# Patient Record
Sex: Female | Born: 1935 | Race: White | Hispanic: No | Smoking: Former smoker
Health system: Southern US, Community
[De-identification: ages and names within clinical notes are randomized; demographics above are authoritative.]

## PROBLEM LIST (undated history)

## (undated) DIAGNOSIS — I739 Peripheral vascular disease, unspecified: Secondary | ICD-10-CM

## (undated) DIAGNOSIS — F419 Anxiety disorder, unspecified: Secondary | ICD-10-CM

## (undated) DIAGNOSIS — E785 Hyperlipidemia, unspecified: Secondary | ICD-10-CM

## (undated) DIAGNOSIS — M1990 Unspecified osteoarthritis, unspecified site: Secondary | ICD-10-CM

## (undated) DIAGNOSIS — I10 Essential (primary) hypertension: Secondary | ICD-10-CM

## (undated) DIAGNOSIS — M79606 Pain in leg, unspecified: Secondary | ICD-10-CM

## (undated) DIAGNOSIS — F0390 Unspecified dementia without behavioral disturbance: Secondary | ICD-10-CM

## (undated) HISTORY — PX: BACK SURGERY: SHX140

## (undated) HISTORY — DX: Hyperlipidemia, unspecified: E78.5

## (undated) HISTORY — DX: Unspecified osteoarthritis, unspecified site: M19.90

## (undated) HISTORY — DX: Essential (primary) hypertension: I10

## (undated) HISTORY — DX: Pain in leg, unspecified: M79.606

## (undated) HISTORY — DX: Peripheral vascular disease, unspecified: I73.9

## (undated) HISTORY — DX: Unspecified dementia, unspecified severity, without behavioral disturbance, psychotic disturbance, mood disturbance, and anxiety: F03.90

## (undated) HISTORY — DX: Anxiety disorder, unspecified: F41.9

---

## 1997-12-25 ENCOUNTER — Other Ambulatory Visit: Admission: RE | Admit: 1997-12-25 | Discharge: 1997-12-25 | Payer: Self-pay | Admitting: *Deleted

## 1999-01-14 ENCOUNTER — Other Ambulatory Visit: Admission: RE | Admit: 1999-01-14 | Discharge: 1999-01-14 | Payer: Self-pay | Admitting: *Deleted

## 2000-08-18 ENCOUNTER — Other Ambulatory Visit: Admission: RE | Admit: 2000-08-18 | Discharge: 2000-08-18 | Payer: Self-pay | Admitting: Gynecology

## 2004-01-22 ENCOUNTER — Encounter: Admission: RE | Admit: 2004-01-22 | Discharge: 2004-01-22 | Payer: Self-pay | Admitting: General Surgery

## 2004-05-14 ENCOUNTER — Encounter: Admission: RE | Admit: 2004-05-14 | Discharge: 2004-05-14 | Payer: Self-pay | Admitting: Family Medicine

## 2004-08-11 ENCOUNTER — Ambulatory Visit: Payer: Self-pay | Admitting: Internal Medicine

## 2007-12-10 ENCOUNTER — Emergency Department (HOSPITAL_COMMUNITY): Admission: EM | Admit: 2007-12-10 | Discharge: 2007-12-10 | Payer: Self-pay | Admitting: Family Medicine

## 2007-12-14 ENCOUNTER — Inpatient Hospital Stay (HOSPITAL_COMMUNITY): Admission: EM | Admit: 2007-12-14 | Discharge: 2007-12-21 | Payer: Self-pay | Admitting: Emergency Medicine

## 2008-04-04 ENCOUNTER — Inpatient Hospital Stay (HOSPITAL_COMMUNITY): Admission: AD | Admit: 2008-04-04 | Discharge: 2008-04-11 | Payer: Self-pay | Admitting: Internal Medicine

## 2008-06-27 ENCOUNTER — Inpatient Hospital Stay (HOSPITAL_COMMUNITY): Admission: EM | Admit: 2008-06-27 | Discharge: 2008-07-01 | Payer: Self-pay | Admitting: Emergency Medicine

## 2008-06-28 ENCOUNTER — Encounter (INDEPENDENT_AMBULATORY_CARE_PROVIDER_SITE_OTHER): Payer: Self-pay | Admitting: Internal Medicine

## 2008-08-02 ENCOUNTER — Emergency Department (HOSPITAL_COMMUNITY): Admission: EM | Admit: 2008-08-02 | Discharge: 2008-08-02 | Payer: Self-pay | Admitting: Emergency Medicine

## 2009-01-31 ENCOUNTER — Emergency Department (HOSPITAL_BASED_OUTPATIENT_CLINIC_OR_DEPARTMENT_OTHER): Admission: EM | Admit: 2009-01-31 | Discharge: 2009-01-31 | Payer: Self-pay | Admitting: Emergency Medicine

## 2009-01-31 ENCOUNTER — Ambulatory Visit: Payer: Self-pay | Admitting: Diagnostic Radiology

## 2009-05-21 IMAGING — CR DG ABDOMEN ACUTE W/ 1V CHEST
3 series · 3 of 3 positions shown · non-contrast
Comparison: 04/04/2008

CLINICAL DATA: Nausea, upper abdominal pain

ACUTE ABDOMEN SERIES (ABDOMEN 2 VIEW & CHEST 1 VIEW)
TECHNIQUE: Two views abdomen one-view chest

[w chest pa]
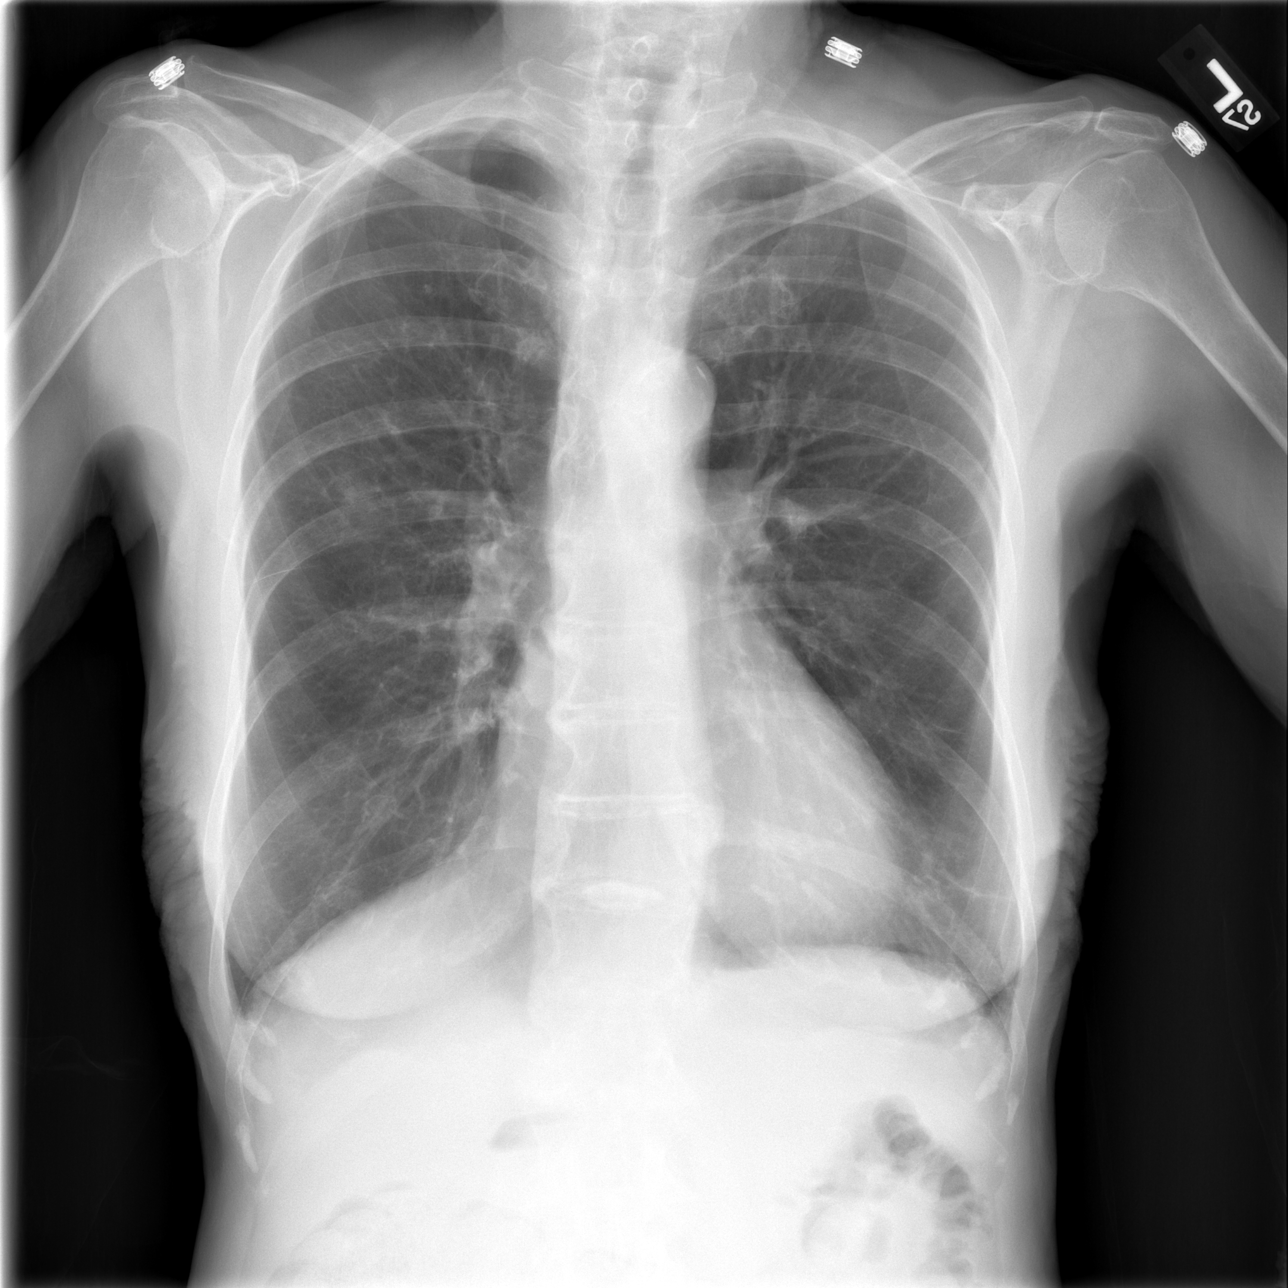

[w abdomen upright]
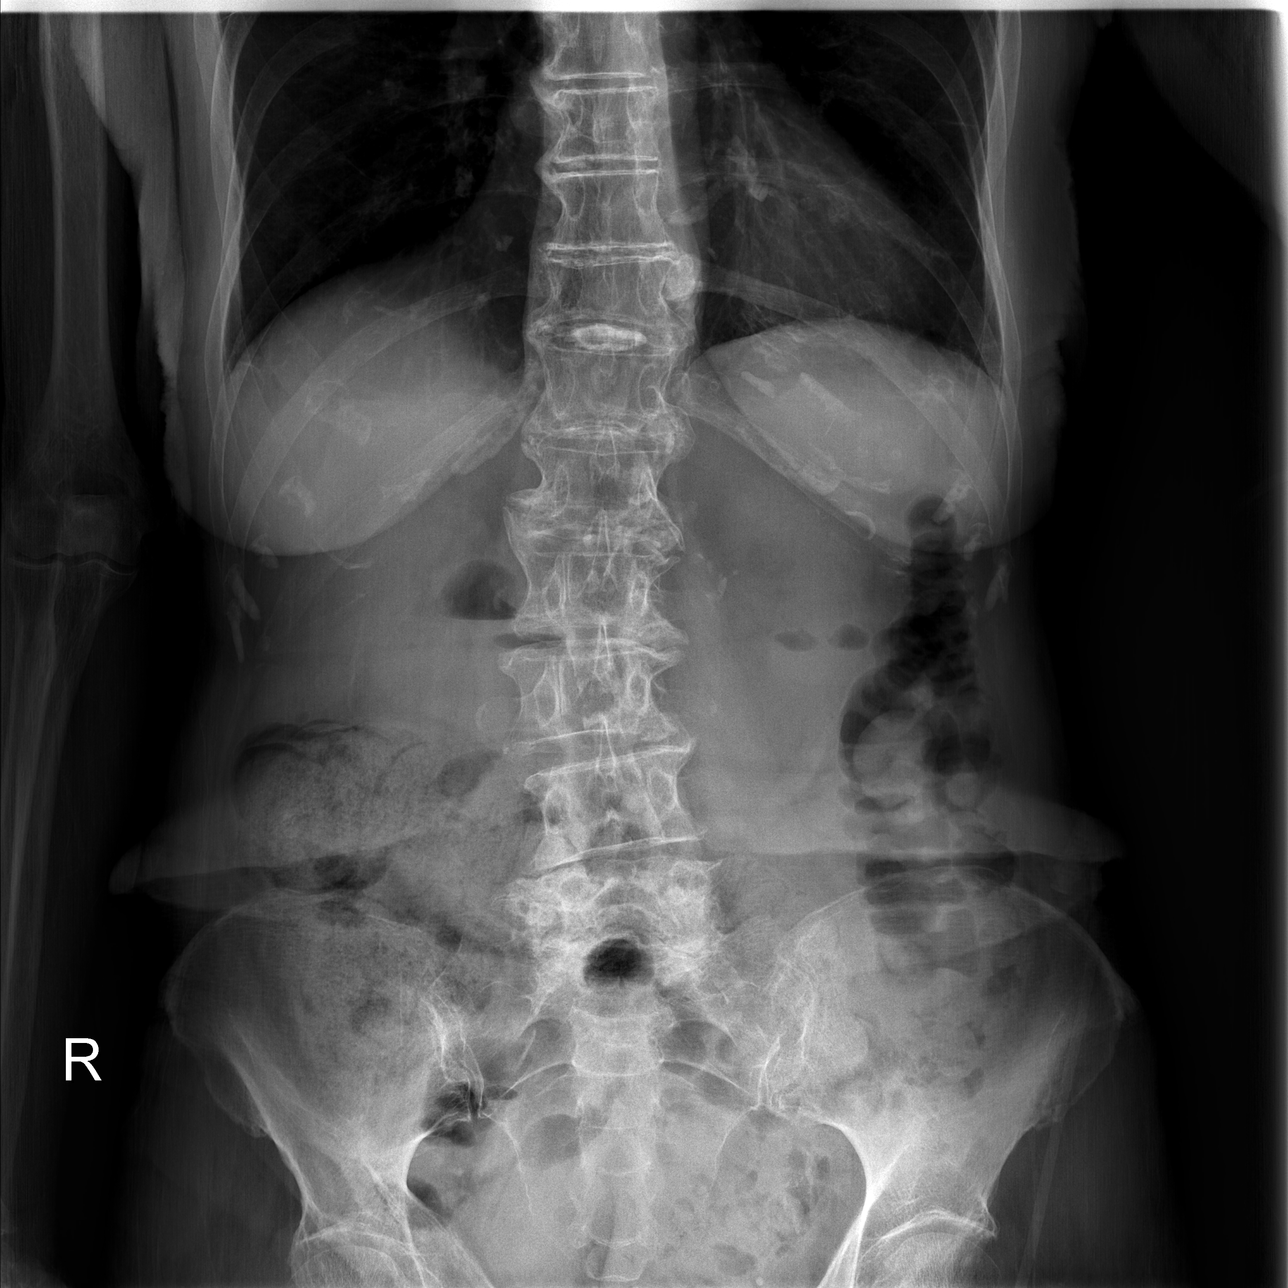

[t abdomen supine]
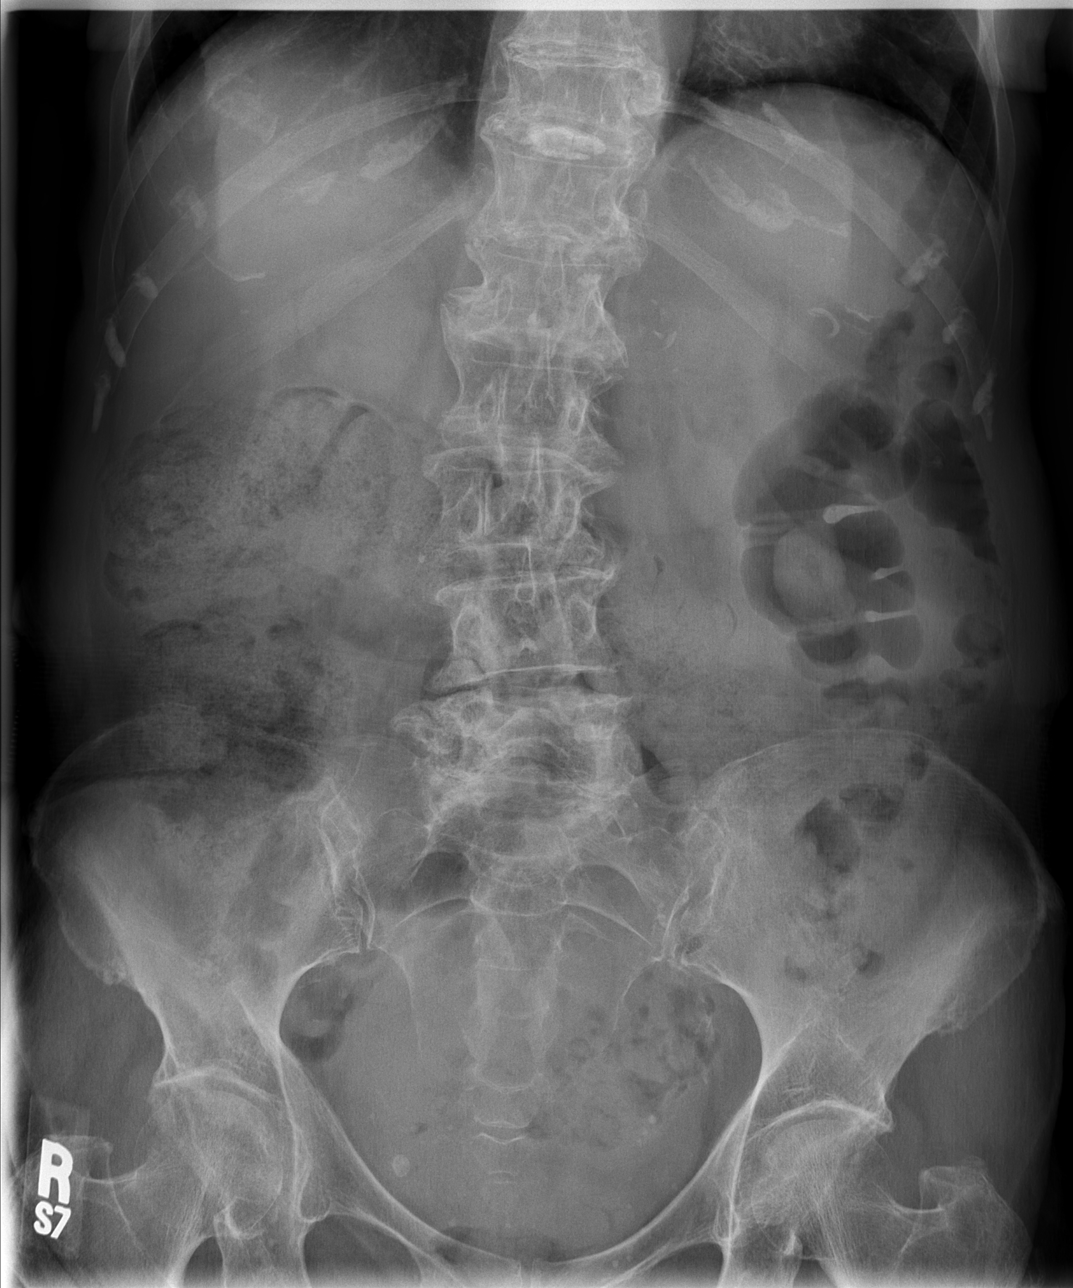

[3 of 3 positions shown; findings below may reference images not displayed]

FINDINGS: Hyperinflation again noted.  Cardiomediastinal silhouette
is stable.  There is a vague nodular density in the right upper
lobe measures about 7 mm.  Correlation with CT scan of the chest is
recommended to exclude a lung nodule.  No acute infiltrate or
edema.

Degenerative changes of thoracolumbar spine are seen.  There is a
nonspecific nonobstructive bowel gas pattern.  Significant stool
noted proximal colon.  Stool noted also in the sigmoid colon.  No
free abdominal air.
IMPRESSION: 1.  Hyperinflation is noted.  No acute infiltrate or edema.  Vague
nodular density right upper lobe.  Correlation with CT scan of the
chest is recommended to exclude a lung nodule.
2.  Degenerative changes thoracolumbar spine.  Colonic stool noted.
No free abdominal air.

## 2009-08-12 ENCOUNTER — Encounter: Admission: RE | Admit: 2009-08-12 | Discharge: 2009-08-12 | Payer: Self-pay | Admitting: Family Medicine

## 2009-08-20 ENCOUNTER — Ambulatory Visit: Payer: Self-pay | Admitting: Vascular Surgery

## 2010-08-09 ENCOUNTER — Encounter: Payer: Self-pay | Admitting: Orthopedic Surgery

## 2010-10-25 LAB — URINALYSIS, ROUTINE W REFLEX MICROSCOPIC
Bilirubin Urine: NEGATIVE
Glucose, UA: NEGATIVE mg/dL
Hgb urine dipstick: NEGATIVE
Ketones, ur: NEGATIVE mg/dL
Nitrite: NEGATIVE
Protein, ur: NEGATIVE mg/dL
Specific Gravity, Urine: 1.004 — ABNORMAL LOW (ref 1.005–1.030)
Urobilinogen, UA: 0.2 mg/dL (ref 0.0–1.0)
pH: 7 (ref 5.0–8.0)

## 2010-10-25 LAB — DIFFERENTIAL
Basophils Absolute: 0 10*3/uL (ref 0.0–0.1)
Basophils Relative: 0 % (ref 0–1)
Eosinophils Absolute: 0.3 10*3/uL (ref 0.0–0.7)
Eosinophils Relative: 4 % (ref 0–5)
Lymphocytes Relative: 33 % (ref 12–46)
Lymphs Abs: 2.5 10*3/uL (ref 0.7–4.0)
Monocytes Absolute: 0.6 10*3/uL (ref 0.1–1.0)
Monocytes Relative: 7 % (ref 3–12)
Neutro Abs: 4.2 10*3/uL (ref 1.7–7.7)
Neutrophils Relative %: 55 % (ref 43–77)

## 2010-10-25 LAB — BASIC METABOLIC PANEL
BUN: 18 mg/dL (ref 6–23)
CO2: 27 mEq/L (ref 19–32)
Calcium: 9.2 mg/dL (ref 8.4–10.5)
Chloride: 102 mEq/L (ref 96–112)
Creatinine, Ser: 1.3 mg/dL — ABNORMAL HIGH (ref 0.4–1.2)
GFR calc Af Amer: 47 mL/min — ABNORMAL LOW (ref >60)
GFR calc non Af Amer: 39 mL/min — ABNORMAL LOW (ref >60)
Glucose, Bld: 114 mg/dL — ABNORMAL HIGH (ref 70–99)
Potassium: 5 mEq/L (ref 3.5–5.1)
Sodium: 138 mEq/L (ref 135–145)

## 2010-10-25 LAB — CBC
HCT: 39.5 % (ref 36.0–46.0)
Hemoglobin: 13.4 g/dL (ref 12.0–15.0)
MCHC: 34.1 g/dL (ref 30.0–36.0)
MCV: 89.9 fL (ref 78.0–100.0)
Platelets: 265 10*3/uL (ref 150–400)
RBC: 4.4 MIL/uL (ref 3.87–5.11)
RDW: 13.1 % (ref 11.5–15.5)
WBC: 7.6 10*3/uL (ref 4.0–10.5)

## 2010-10-25 LAB — POCT CARDIAC MARKERS
CKMB, poc: <1 ng/mL — ABNORMAL LOW (ref 1.0–8.0)
CKMB, poc: <1 ng/mL — ABNORMAL LOW (ref 1.0–8.0)
Myoglobin, poc: 64.5 ng/mL (ref 12–200)
Myoglobin, poc: 70.4 ng/mL (ref 12–200)
Troponin i, poc: <0.05 ng/mL (ref 0.00–0.09)
Troponin i, poc: <0.05 ng/mL (ref 0.00–0.09)

## 2010-10-25 LAB — URINE CULTURE: Colony Count: 9000

## 2010-10-25 LAB — URINE MICROSCOPIC-ADD ON

## 2010-10-30 ENCOUNTER — Other Ambulatory Visit (INDEPENDENT_AMBULATORY_CARE_PROVIDER_SITE_OTHER): Payer: Medicare PPO

## 2010-10-30 ENCOUNTER — Encounter (INDEPENDENT_AMBULATORY_CARE_PROVIDER_SITE_OTHER): Payer: Medicare PPO

## 2010-10-30 DIAGNOSIS — I6529 Occlusion and stenosis of unspecified carotid artery: Secondary | ICD-10-CM

## 2010-10-30 DIAGNOSIS — I739 Peripheral vascular disease, unspecified: Secondary | ICD-10-CM

## 2010-10-30 DIAGNOSIS — I10 Essential (primary) hypertension: Secondary | ICD-10-CM

## 2010-11-02 LAB — CBC
HCT: 34.7 % — ABNORMAL LOW (ref 36.0–46.0)
Hemoglobin: 11.8 g/dL — ABNORMAL LOW (ref 12.0–15.0)
MCHC: 33.9 g/dL (ref 30.0–36.0)
MCV: 88.5 fL (ref 78.0–100.0)
Platelets: 282 10*3/uL (ref 150–400)
RBC: 3.93 MIL/uL (ref 3.87–5.11)
RDW: 15.1 % (ref 11.5–15.5)
WBC: 13.2 10*3/uL — ABNORMAL HIGH (ref 4.0–10.5)

## 2010-11-02 LAB — COMPREHENSIVE METABOLIC PANEL
ALT: 11 U/L (ref 0–35)
Albumin: 3.1 g/dL — ABNORMAL LOW (ref 3.5–5.2)
BUN: 20 mg/dL (ref 6–23)
Calcium: 9 mg/dL (ref 8.4–10.5)
Glucose, Bld: 106 mg/dL — ABNORMAL HIGH (ref 70–99)
Sodium: 132 mEq/L — ABNORMAL LOW (ref 135–145)
Total Protein: 5.5 g/dL — ABNORMAL LOW (ref 6.0–8.3)

## 2010-11-02 LAB — URINALYSIS, ROUTINE W REFLEX MICROSCOPIC
Glucose, UA: NEGATIVE mg/dL
Leukocytes, UA: NEGATIVE
pH: 5.5 (ref 5.0–8.0)

## 2010-11-02 LAB — URINE MICROSCOPIC-ADD ON

## 2010-11-02 LAB — DIFFERENTIAL
Lymphs Abs: 1.8 10*3/uL (ref 0.7–4.0)
Monocytes Relative: 9 % (ref 3–12)
Neutro Abs: 10.1 10*3/uL — ABNORMAL HIGH (ref 1.7–7.7)
Neutrophils Relative %: 77 % (ref 43–77)

## 2010-11-02 LAB — URINE CULTURE

## 2010-11-05 NOTE — Procedures (Unsigned)
CAROTID DUPLEX EXAM  INDICATION:  Carotid artery stenosis.  HISTORY: Diabetes:  No. Cardiac: Hypertension:  Yes. Smoking:  Previous. Previous Surgery:  No. CV History:  No. Amaurosis Fugax No, Paresthesias No, Hemiparesis No                                      RIGHT             LEFT Brachial systolic pressure:         210               213 Brachial Doppler waveforms:         WNL               WNL Vertebral direction of flow:        Antegrade         Antegrade DUPLEX VELOCITIES (cm/sec) CCA peak systolic                   89                81 ECA peak systolic                   176               145 ICA peak systolic                   216               135 ICA end diastolic                   46                28 PLAQUE MORPHOLOGY:                  Heterogeneous     Heterogeneous PLAQUE AMOUNT:                      Moderate          Mild to moderate PLAQUE LOCATION:                    CCA, ICA, ECA     CCA, ICA, ECA  IMPRESSION: 1. Right internal carotid artery stenosis in the 40% to 59% range. 2. Left internal carotid artery stenosis in the 1% to 39% range. 3. Internal carotid artery velocities are essentially unchanged since     previous study on 08/20/2009.  ___________________________________________ Janetta Hora. Fields, MD  SH/MEDQ  D:  10/30/2010  T:  10/30/2010  Job:  [id number]

## 2010-11-05 NOTE — Procedures (Unsigned)
RENAL ARTERY DUPLEX EVALUATION  INDICATION:  Uncontrolled hypertension.  HISTORY: Diabetes:  No. Cardiac: Hypertension:  Yes. Smoking:  Previous.  RENAL ARTERY DUPLEX FINDINGS: Aorta-Proximal:  122 cm/s Aorta-Mid:  296 cm/s Aorta-Distal:  207 cm/s Celiac Artery Origin:  335 cm/s SMA Origin:  512 cm/s                                   RIGHT               LEFT Renal Artery Origin:             315 cm/s            238 cm/s Renal Artery Proximal:           167 cm/s            241 cm/s Renal Artery Mid:                95 cm/s             71 cm/s Renal Artery Distal:             101 cm/s            36 cm/s Hilar Acceleration Time (AT): Renal-Aortic Ratio (RAR):        N/A                 N/A Kidney Size:                     10.4 cm             7.6 cm End Diastolic Ratio (EDR): Resistive Index (RI):            0.87/0.76           0.98/0.70  IMPRESSION: 1. Severe aortic stenosis present with a peak systolic velocity of 296     cm/s in the mid segment. 2. Elevated velocities suggestive of >75% stenosis of the celiac and     superior mesenteric arteries. 3. Bilateral renal arteries present with elevated velocities     suggestive of hemodynamically significant stenosis. 4. Resistive index of the bilateral kidneys is abnormal. 5. Asymmetry is identified between the right and left kidney.  The     right measures 10.4 cm and the left measures 7.6 cm.  ___________________________________________ Janetta Hora. Fields, MD  SH/MEDQ  D:  10/30/2010  T:  10/30/2010  Job:  [id number]

## 2010-11-19 ENCOUNTER — Encounter (INDEPENDENT_AMBULATORY_CARE_PROVIDER_SITE_OTHER): Payer: Medicare PPO | Admitting: Vascular Surgery

## 2010-11-19 DIAGNOSIS — I10 Essential (primary) hypertension: Secondary | ICD-10-CM

## 2010-11-20 NOTE — Assessment & Plan Note (Signed)
OFFICE VISIT  Kendra, [name] DOB:  Apr 28, 1919                                       11/19/2010 JYNWG#:[id number]  CHIEF COMPLAINT:  Poorly controlled blood pressure.  HISTORY OF PRESENT ILLNESS:  The patient is a 77 year old female that we have previously followed for some component of peripheral arterial disease.  She was referred today for poorly controlled blood pressure and suspected renal artery hypertension.  She was last seen in February of 2011 at which time conservative measures were taken for management of her mild peripheral arterial disease in her lower extremities.  We have also followed her in the past for a moderate carotid stenosis which has also been asymptomatic.  The patient also has a history of mild dementia and lives at [facility].  As far as her blood pressure is concerned she currently takes Diovan 320 mg once a day, clonidine 0.3 mg twice a day, hydralazine 25 mg 3 times a day, amlodipine 5 mg once a day.  Her blood pressure has been primarily running in the 180s and this is documented from her recent visit at [facility] on March 26 as well as multiple times at her assisted living facility.  Other chronic medical problems include elevated cholesterol, hypothyroidism, asthma, chronic low back pain with degenerative disk disease and osteoporosis.  This is all currently stable and followed by Dr. Renato Gails.  The patient denies any history of severe headaches.  She denies any history of stroke.  She does not know if she has any history of renal dysfunction.  PAST SURGICAL HISTORY:  Back surgery.  SOCIAL HISTORY:  She is retired.  She is widowed.  She has 3 children. She is a former smoker, quit in 1959.  She does not consume alcohol regularly.  FAMILY HISTORY:  Not remarkable for vascular disease at a young age.  REVIEW OF SYSTEMS:  GENERAL:  She is 5 feet 1 inch, 116 pounds. CARDIAC:  She denies chest pain or  shortness of breath.  However, she is minimally active. PULMONARY:  She has a history of asthma. MUSCULOSKELETAL:  She has multiple joint and arthritis pain. PSYCHIATRIC:  She has mild anxiety. Hematologic, urinary, GI, skin, neurologic, ENT review of systems were all negative.  PHYSICAL EXAM:  Vital signs:  Oxygen saturation 97% on room air, heart rate 69 and regular, respirations 16.  The patient refused to have her blood pressure taken.  HEENT:  Unremarkable.  Neck:  Has 2+ carotid pulses with a left-sided bruit.  Chest:  Clear to auscultation. Cardiac:  Exam is regular rate and rhythm without murmur.  Abdomen: Soft, nontender, nondistended.  No bruits.  Musculoskeletal:  Exam shows no obvious major joint deformities.  Neurological:  She has symmetric upper extremity and lower extremity motor strength which is 5/5.  Skin: Has an open ulcers or rashes.  Peripheral vascular exam:  She has 2+ femoral pulses with absent popliteal and pedal pulses bilaterally.  She had a carotid duplex exam on 10/30/2010 for further evaluation of her moderate carotid stenosis.  This again showed a 40%-60% right internal carotid artery stenosis with less than 40% left internal carotid artery stenosis.  She had bilateral ABIs which were 0.72 on the right, 0.59 on the left.  This is slightly decreased from February of 2011 but still in the mild category.  She also had  a renal duplex exam performed which showed evidence of a mid aortic stenosis as well as greater than 75% stenosis of her celiac and superior mesenteric arteries and elevation of velocity of renal arteries bilaterally suggesting possible bilateral renal artery stenosis.  However, her resistive index was also elevated in the left kidney and the left kidney was atrophied at 7.6 cm suggesting minimal or at least poor function.  The right side kidney size was 10.4 cm but the resistive index was also elevated at 0.87 suggesting again  some component of medical renal disease.  In summary, the patient's moderate carotid disease and moderate to mild peripheral arterial disease are stable with no significant change in her symptoms or in her objective measurement on noninvasive vascular exam.  As far as renal hypertension is concerned the patient does have evidence on renal duplex exam of aortic stenosis as well as stenosis of her visceral and renal vessels.  She is also on multiple blood pressure medications with marginal control of her blood pressure.  I believe the best option for her would be an arteriogram with a view to possible angioplasty and stenting of her renal arteries.  She is asymptomatic from a mesenteric artery standpoint and I would not address that at that time.  If she is not a candidate for a percutaneous intervention in her renal arteries I do not believe that she is going to be a very good open operative candidate and I discussed this with the patient and her family today and they are in agreement with this.  They asked several questions about the long-term consequences of longstanding hypertension.  I emphasized to them that they should also take in consideration that she is 77 years old and that certainly the sequelae of long-term hypertension is not trivial with possible renal failure, risk of stroke, myocardial infarction.  However, we will have to weight the balance of risk of procedure versus benefit of procedure in this frail 77 year old. Her arteriogram is scheduled for 12/18/2010 as currently her other son is out of the country and they wanted to wait until he returned.  The family will call me if they have any changes in their mind as far as the procedure is concerned.  Risks, benefits, possible complications were explained in detail to them today.    Kendra Hora. Fields, MD Electronically Signed  CEF/MEDQ  D:  11/20/2010  T:  11/20/2010  Job:  [id number]  cc:   Kendra Spikes, DO

## 2010-12-01 NOTE — Discharge Summary (Signed)
[name], [name]                ACCOUNT NO.:  [id number]   MEDICAL RECORD NO.:  [id number]          PATIENT TYPE:  INP   LOCATION:  4730                         FACILITY:  [facility]   PHYSICIAN:  Beckey Rutter, MD  DATE OF BIRTH:  1918/11/23   DATE OF ADMISSION:  06/27/2008  DATE OF DISCHARGE:  07/01/2008                               DISCHARGE SUMMARY   CHIEF COMPLAINT:  Nausea and vomiting.   BRIEF HISTORY OF PRESENT ILLNESS:  An 77 year old pleasant female  brought in from assisted living at [facility] and found to have  severe bradycardia.   HOSPITAL COURSE:  1. During the hospital stay, the patient's beta-blocker was      discontinued.  The the heart rate improved after discontinuation of      the metoprolol.  The patient then transferred from the step-down      monitoring unit to telemetry monitoring unit.  During the 48 hours      after the transfer, the patient's heart rates remained stable as      well.  The patient was seen by cardiology consultation and 2-D echo      obtained, result as below.  The patient is stable for discharge      today.  2. Hypertension.  As mentioned above the metoprolol was discontinued.      The patient was started on hydralazine 25 mg p.o. t.i.d. and      clonidine 0.3 mg p.o. b.i.d.  The blood pressure will need to be      further monitored in the assisted living and medication prescribed      as needed.  Her blood pressure is still elevated, although she is      stable for discharge.  Furthermore, her clonidine dose is increased      from 0.1 mg 4 times a day to 0.3 mg two times a day.  3. The patient was complaining of sciatica, and she is unable to walk      while she is in the hospital.  I will go ahead and order home      health physical therapy to continue with her in the assisted      living.  The patient will be continued on pain control and my      understanding is she has an appointment today with the chiropractor      who  injected her with a steroid a couple of weeks ago but without      really improvement of her pain control.   HOSPITAL PROCEDURES:  A 2-D echo done on June 28, 2008 and read by  Dr. Nicki Guadalajara.  Summary:  1. Showing overall left ventricular ejection fraction is 55%.  Left      ventricular wall thickness was mildly to moderately increased.  2. Aortic valve thickness was moderately increased.  The findings are      consistent with mild aortic valve stenosis.  The aortic valvular      diltation is mild and the transaortic valve gradient was 12 mmHg.      Estimated  aortic valve area by VTI was 1.5 cm square.  Estimated      aortic valve area by V-max was 1.28 cm2.  3. There was moderate to marked mitral annular and some mitral      calcification particularly involving the posterior leaflet.  There      was mild mitral valvular regurgitation.  Mitral valve area by      pressure half-time was 2.2 cm square.  4. Left atrial size was at the upper limits of normal.   LABS:  Today showing sodium 136, potassium 3.8, chloride 105, bicarb 28,  glucose 102, BUN 11, creatinine 1.15, calcium 8.7, white blood count is  7.1, hemoglobin is 10.9, hematocrit 32.0 and platelet count is 242,000.  Ferritin in blood is 74, folic acid is 8.2.  Iron is 63.  Total iron  binding capacity is 247.   DISCHARGE DIAGNOSIS:  1. Severe bradycardia felt secondary to metoprolol/beta-blocker      resolved.  2. Hypertension uncontrolled.  3. Dementia.  4. Left leg sciatic pain.  5. Hypothyroidism.  6. History of lower back pain status post L4-L5 microdiskectomy.  7. Normocytic anemia.   DISCHARGE MEDICATIONS:  1. Vicodin 10/650 every 6 hours as needed.  2. Senokot-S 1 to 2 tablets at bedtime, hold for loose stool.  3. Preparation H cream 3 times daily as needed.  4. Lidoderm patch to the right knee as needed.  5. Xanax 0.25 mg every 8 hours as needed.  6. Zofran 4 mg every hours as needed.  7. Ventolin inhaler  every 2 hours as needed.  8. Dulcolax rectal suppositories at bedtime as needed.  9. Norvasc 10 mg p.o. daily.  10.Aspirin 81 mg daily.  11.Diovan 160 mg daily.  12.Synthroid 50 mcg daily.  13.Omeprazole 20 mg daily.  14.Potassium 20 mEq daily for 5 days, potassium level in the blood      should be checked prior continuation of this medicine.  15.Aricept 10 mg daily.  16.Hydralazine 25 mg p.o. t.i.d.  17.Clonidine/Catapres 0.3 mg p.o. b.i.d.   DISCHARGE/PLAN:  The patient had electrolyte derangement previously and  she was taken off hydrochlorothiazide.  This time she had severe  bradycardia and she should be of the beta-blocker.  I would recommend  the patient electrolytes checked frequently for the time period after  discharge.  This discharge plan was formulated with the agreement of the  patient and her sons.      Beckey Rutter, MD  Electronically Signed     EME/MEDQ  D:  07/01/2008  T:  07/01/2008  Job:  [id number]

## 2010-12-01 NOTE — H&P (Signed)
[name], [name]                ACCOUNT NO.:  [id number]   MEDICAL RECORD NO.:  [id number]          PATIENT TYPE:  INP   LOCATION:  1437                         FACILITY:  [facility]   PHYSICIAN:  Beckey Rutter, MD  DATE OF BIRTH:  04-12-1919   DATE OF ADMISSION:  04/04/2008  DATE OF DISCHARGE:                              HISTORY & PHYSICAL   PRIMARY CARE PHYSICIAN:  Production assistant, radio, doctor Stone.   CHIEF COMPLAINT:  Confusion.   HISTORY OF PRESENT ILLNESS:  This is an 77 year old very pleasant  Caucasian female with past medical history significant for lumbar pain,  hypertension and thyroid disease as well as senile dementia who came in  today after she received a call from her primary physician, Dr. Larina Bras,  to come to the hospital for severe hyponatremia.  As per son the  patient's sodium is 115.   The patient was in her usual state of health up to a week or so when she  started to feel very weak.  The patient was diagnosed with urinary tract  infection and was started on Cipro.  The patient then became severely  confused and unable to understand even simple commands while she was  taking the Cipro.  The patient was taken off of the Cipro by the son and  then she started to improve in terms of her mentation.  Today the  patient was found to have low sodium and low potassium and she was  advised to come to the hospital.  I could not locate any labs from the  primary physician at this time.  Nevertheless, the patient will have  CMET and CBC as well as chest x-ray for this admission purpose.   PAST MEDICAL HISTORY:  1. Hypothyroidism.  The patient is on Synthroid 75 mcg daily.  2. Hypertension.  3. Questionable dementia.  4. History of lumbar and right leg pain status post L4-L5      microdiskectomy.   MEDICATION ALLERGIES:  The only medication allergy listed here is:  1. DARVOCET.  2. CELEBREX.  3. LIDOCAINE WITH EPINEPHRINE.   MEDICATION:  Provided by her  son today includes:  1. Levoxyl 75 mcg daily.  2. Lisinopril 40 mg 2 tablets daily.  3. Amlodipine/Norvasc 10 mg daily.  4. Hydrochlorothiazide 25 mg half tab daily.   SOCIAL HISTORY:  The patient lives with her son and his family.  She  used to be active up to a few months back when she had the back surgery  and then she became too weak to drive by herself.  No significant  tobacco or ethanol abuse.   FAMILY HISTORY:  Noncontributory.   PHYSICAL EXAMINATION:  VITAL SIGNS:  Blood pressure here is 135/55.  HEAD:  Atraumatic, normocephalic.  EYES:  PERRL.  ORAL:  Mouth moist.  No ulcer.  NECK:  Supple.  No JVD.  LUNGS:  Bilateral decreased air entry.  No significant adventitious  sounds.  PRECORDIUM:  First and second heart sound distally audible.  ABDOMEN:  Soft.  The patient has some tenderness on her abdominal  examination.  Bowel sounds present.  NEUROLOGICAL:  The patient is alert, orientated.  She is able to engage  in conversation although her answer is not 100% accurate.  I would say  the patient is oriented x3.   LABS AND X-RAYS:  Her lab tests and x-rays are still pending.   ASSESSMENT AND PLAN:  1. This is an 77 year old female with hyponatremia and hypokalemia      likely secondary to the hydrochlorothiazide.  I will obtain labs      for the purpose of this admission on a stat basis.  The patient      will be taken off of the hydrochlorothiazide and all      antihypertensive medication.  Will replete potassium and monitor      sodium to prevent sudden correction.  I will obtain CT head as well      to rule out acute intracranial event.  2. The patient will be admitted to telemetry floor.  3. The patient was complaining of constipation, will consider enema.  4. Questionable dementia.  I will leave the mini mental state      assessment to her primary physician.  5. Will consider physical therapy during this hospital stay and      evaluate for eligibility to rehab  floor.  6. For gastrointestinal prophylaxis I will consider Protonix.  7. For deep venous thrombosis prophylaxis I will start Lovenox.  8. Further management will be done as the patient's condition evolves      and the rest of the lab work and investigations.      Beckey Rutter, MD  Electronically Signed     EME/MEDQ  D:  04/04/2008  T:  04/07/2008  Job:  [id number]   cc:   Dr. Lenore Manner FP

## 2010-12-01 NOTE — Procedures (Signed)
CAROTID DUPLEX EXAM   INDICATION:  Left carotid bruits.   HISTORY:  Diabetes:  No.  Cardiac:  No.  Hypertension:  Yes.  Smoking:  Previous.  Previous Surgery:  No.  CV History:  No.  Amaurosis Fugax No, Paresthesias No, Hemiparesis No.                                       RIGHT             LEFT  Brachial systolic pressure:         158               154  Brachial Doppler waveforms:         WNL               WNL  Vertebral direction of flow:        Not visualized well                 Antegrade  DUPLEX VELOCITIES (cm/sec)  CCA peak systolic                   110               110  ECA peak systolic                   270               218  ICA peak systolic                   202               162  ICA end diastolic                   39                32  PLAQUE MORPHOLOGY:                  Heterogeneous     Heterogeneous  PLAQUE AMOUNT:                      Moderate          Mild to moderate  PLAQUE LOCATION:                    ICA, ECA          ICA, ECA   IMPRESSION:  1. Patient with 60% to 79% stenosis of the right internal carotid      artery.  2. Patient with 40% to 59% stenosis of the left internal carotid      artery.  3. Antegrade flow in left vertebral; right vertebral not visualized      well.  4. Bilateral external carotid artery stenoses.   ___________________________________________  Janetta Hora Fields, MD   CB/MEDQ  D:  08/20/2009  T:  08/20/2009  Job:  [id number]

## 2010-12-01 NOTE — H&P (Signed)
NAMEARVIE, VILLARRUEL                ACCOUNT NO.:  [id number]   MEDICAL RECORD NO.:  [id number]          PATIENT TYPE:  INP   LOCATION:  1846                         FACILITY:  [facility]   PHYSICIAN:  Ladell Pier, M.D.   DATE OF BIRTH:  1918-07-24   DATE OF ADMISSION:  06/27/2008  DATE OF DISCHARGE:                              HISTORY & PHYSICAL   CHIEF COMPLAINT:  Nausea, vomiting x1 today.   HISTORY OF PRESENT ILLNESS:  The patient is a 77 year old white female  that resides at [facility].  The patient stated that she was at  home at [facility] today.  She was feeling fine and went to the  bathroom to have a bowel movement.  During the time of her bowel  movement she had an episode of nausea, vomiting in a trash can.  She did  not pass out.  She had no chest pain, no shortness of breath, did not  feel lightheaded.  She felt fine prior to the episode.  She was sent to  the emergency room for further evaluation.  In the emergency room her  heart rate was 29.  She was sinus brady.   PAST MEDICAL HISTORY:  Significant for:  1. Hypothyroidism.  2. Hypertension.  3. Mild dementia.  4. History of lower back pain, status post L4-L5 microdiskectomy.  5. Normocytic anemia.   FAMILY HISTORY:  Noncontributory.   SOCIAL HISTORY:  The patient was living with her son because of the  chronic and severe back pain.  She is now at assisted living [facility].  Does not smoke or drink alcohol.  She has 4 children, one is  deceased.   MEDICATIONS:  1. Metoprolol 25 mg every 12 hours.  2. Vicodin 10/650 every 6 hours as needed.  3. Senokot-S at bedtime.  4. Preparation H cream 3 times daily as needed.  5. Lidoderm patch to the right knee as needed.  6. Xanax 0.25 mg every 8 hours as needed.  7. Zofran 4 mg every 8 hours as needed.  8. Ventolin inhaler ever 2 hours as needed.  9. Dulcolax rectal suppository at bedtime as needed.  10.Amlodipine 10 mg daily.  11.Aspirin 81 mg  daily.  12.Diovan 160 mg daily.  13.Levothyroxine 50 mcg daily.  14.Omeprazole  20 mg daily.  15.Potassium 20 mEq/15 mL, 30 mL.  16.Aricept 10 mg daily.   ALLERGIES:  1. DARVOCET.  2. CELEBREX.  3. LIDOCAINE WITH EPINEPHRINE.   REVIEW OF SYSTEMS:  Negative, otherwise stated in HPI.   PHYSICAL EXAM:  Temperature 97.5, blood pressure 176/50, pulse of 29,  now 42, respirations 18, pulse ox 100% on room air.  GENERAL:  Patient lying in bed, does not seem to be in any acute  distress.  HEENT:  Normocephalic, atraumatic.  Pupils equal, round and reactive to  light without erythema.  CARDIOVASCULAR:  She is slightly bradycardic.  LUNGS:  Were clear bilaterally.  ABDOMEN:  Soft, nontender, nondistended.  Positive bowel sounds.  EXTREMITIES:  Without edema.   LABS:  WBC 11.7, hemoglobin 10.7, MCV 90.3, platelets 306, myoglobin  75,  MB less than 1.0, troponin less than 0.05.  Sodium 133, potassium 5.9,  repeat potassium 5, chloride 104, CO2 22, glucose 112, BUN 22,  creatinine 1.57, calcium 8.9.  UA:  Greater than 300 protein, magnesium  2.4.  EKG shows sinus brady.   ASSESSMENT AND PLAN:  1. Bradycardia.  2. Nausea, vomiting.  3. Hypothyroidism.  4. Hypertension.  5. Hyperkalemia.  6. Chronic back pain.  7. Leukocytosis.  8. Normocytic anemia.  9. Mild hyponatremia.  10.Proteinuria.  11.Renal insufficiency, baseline creatinine 0.9.   Admit patient to the hospital.  Cardiology was consulted.  Beta blocker  discontinued.  Will continue her other home medications, 2-D echo, check  TSH, cardiac markers, EKG repeat in the morning.  Will not do an anemia  workup.  She just had B12, folate level checked on her previous  admission.  With her mild proteinuria will repeat the UA as it was not  present a couple of months ago during her last admission.  Giving her  fluids to hydrate with her creatinine being elevated mildly, most likely  secondary to dehydration.      Ladell Pier, M.D.  Electronically Signed     NJ/MEDQ  D:  06/27/2008  T:  06/27/2008  Job:  [id number]   cc:   Dr. Jerolyn Shin, M.D.

## 2010-12-01 NOTE — Assessment & Plan Note (Signed)
OFFICE VISIT   [name], APACHITO  DOB:  [birth year]                                       08/20/2009  ZOXWR#:[id number]   CHIEF COMPLAINT:  Leg pain.   HISTORY OF PRESENT ILLNESS:  The patient is an 77 year old female  referred by Dr. Larina Bras for evaluation of lower extremity pain.  The  patient has had chronic back pain and has had previous lumbar spine  surgery by Dr. Darrelyn Hillock in 2009.  She currently sees Dr. Ethelene Hal for  chronic pain issues.  She also has continuous chronic right knee pain  and is being seen by Dr. Charlann Boxer for consideration of right knee  replacement.  She has a history of bilateral neuropathy.  She currently  walks with a walker.  She has had pain in her right leg if she walks a  long distance.  However, on further questioning she really is unable to  walk a long enough distance to elicit pain in her calf secondary to the  fact that she only walks a limited amount of distance in her assisted  living facility.  She does not describe rest pain.  She has not had any  history of nonhealing ulcers on her feet.   CHRONIC MEDICAL PROBLEMS:  Include hypertension and elevated  cholesterol, both of which are controlled.   FAMILY HISTORY:  Significant for her mother who died of a myocardial  infarction at age 89.  She had a brother who died in his 77s from heart  problems.   SOCIAL HISTORY:  She is widowed, has four children.  She is a retired  housewife.  She is a former smoker but quit greater than 30 years ago.  She does not consume alcohol regularly.   REVIEW OF SYSTEMS:  Full 12 point review of systems was performed with  the patient today.  Please see intake referral form for details  regarding this.   PHYSICAL EXAM:  Vital signs:  Blood pressure is 138/55 in the left arm,  heart rate is 45 and regular.  Oxygen saturation is 96% on room air.  HEENT:  Unremarkable.  Neck:  Has 2+ carotid pulses with a left-sided  carotid bruit.  Chest:  Clear to  auscultation.  Cardiac:  Regular rate  and rhythm without murmur.  Abdomen:  Is soft with some right lower  quadrant soreness.  She has normoactive bowel sounds.  She has no  rebound or guarding.  She has no masses.  Extremities:  She has 2+  radial, 2+ femoral and 1+ dorsalis pedis pulses bilaterally.  Extremities have no significant edema.  Musculoskeletal:  Shows no  obvious major deformities.  Neurologic:  Shows symmetric upper extremity  and lower extremity motor strength which is 5/5.  Skin:  Has no open  ulcers or rashes.   She had bilateral ABIs performed today which were 0.92 on the right,  0.71 on the left.  In light of her carotid bruit  we also did a carotid  duplex exam which showed a 60%-80% stenosis of the right internal  carotid artery and 40%-60% left internal carotid artery stenosis.  She  had antegrade vertebral flow on the left side, right vertebral artery  was not visualized.   In summary, the patient has mild arterial occlusive disease in both  lower extremities.  I do not believe she  has claudication symptoms as  she is only walking minimal distances at her assisted living facility.  I do not believe revascularization is indicated at this time.  She  currently has reasonable perfusion that she is not at risk of limb loss.  She is not a very good overall revascularization candidate due to her  minimal ambulatory status, her age and overall disability.  Fortunately  I do not believe she requires revascularization since her perfusion is  reasonable and essentially asymptomatic.  She does have some neuropathy.  I discussed with the patient and her son today that this could be  multifactorial and does not necessarily need to be related to her  arterial occlusive disease.  This could also be related to chronic back  pain as well as sometimes idiopathic.   I believe the best option for her as far as her lower extremities are  concerned is continued surveillance.  Will  repeat her ABIs in six  months' time.  Will also repeat her carotid duplex exam in six months'  time to make sure her carotid stenosis does not progress.  Of note, she  was asymptomatic from a carotid standpoint and denied any symptoms of  TIA, amaurosis or stroke.     Janetta Hora. Fields, MD  Electronically Signed   CEF/MEDQ  D:  08/20/2009  T:  08/21/2009  Job:  [id number]   cc:   Dr Ethelene Hal  Ace Gins, MD  Madlyn Frankel. Charlann Boxer, M.D.

## 2010-12-01 NOTE — Discharge Summary (Signed)
[name], [name]                ACCOUNT NO.:  [id number]   MEDICAL RECORD NO.:  [id number]          PATIENT TYPE:  INP   LOCATION:  1507                         FACILITY:  [facility]   PHYSICIAN:  Theodosia Paling, MD    DATE OF BIRTH:  24-Jul-1918   DATE OF ADMISSION:  04/04/2008  DATE OF DISCHARGE:  04/11/2008                               DISCHARGE SUMMARY   ADMISSION HISTORY:  An 77 year old Caucasian lady with a history of  lumbar pain, hypertension, and thyroid disease as well as senile  dementia, was admitted for severe hyponatremia with a serum sodium of  115.  Patient has been feeling progressively lethargic.  She was  recently started on Ciplox for a presumed UTI.  Patient on presentation  was confused and not following commands.  When the patient was taken off  the ciprofloxacin, her mentation was improving.  Patient was found to  have hyponatremia and was recommended to come to the hospital.  Patient  was admitted under [facility] for further evaluation and management.   HOSPITAL COURSE:  The following issues were addressed during the  hospitalization:  1. Confusion:  Patient's confusion resolved, as the hyponatremia was      resolving.  We felt most likely it was secondary to HCTZ.  When      HCTZ was held, her serum sodium improved.  At the time of the last      few days, her serum sodium has been almost within low-normal range,      and she is oriented x3.  2. Hyponatremia:  As mentioned in #1, HCTZ was held, which was most      likely culprit for hypernatremia.  3. Hyperthyroidism:  Levoxyl was continued.  4. Hypertension:  Earlier, patient was taking lisinopril/HCTZ, which      was held.  Amlodipine at 10 mg was continued.  Along with that,      metoprolol was added.  Despite that, her blood pressure was running      towards the high side; therefore, hydralazine 50 mg p.o. q.6h. is      started.  5. Fatigue and generalized deconditioning:  Physical therapy evaluated     the patient, which recommended skilled nursing facility.  Patient      at this time is getting transferred to SNF for strengthening      exercises and regaining her strength back.   IMAGING PERFORMED:  1. CT scan of the head without contrast did not show any acute      intracranial events.  2. Chest x-ray:  No acute findings.  Done on September 17.   PROCEDURE PERFORMED:  None.   MICROBIOLOGY:  Urine culture done on April 06, 2008 showed multiple  bacterial morphotypes.  Patient was feeling dysuria, so she received a  few days of ciprofloxacin.  As a result, her urine culture is negative.   DISCHARGE MEDICATIONS:  1. Hydralazine 50 mg p.o. q.6h.  2. Aspirin 81 mg p.o. daily.  3. Protonix 40 mg p.o. daily.  4. Senokot 1 tablet p.o. nightly p.r.n. constipation.  5. Synthroid 75  mcg p.o. daily.  6. Amlodipine 10 mg p.o. daily.  7. KCL 40 mEq p.o. daily.  8. Metoprolol 25 mg p.o. q.12h.  9. Tylenol 325 mg p.o. q.4h. as needed for pain.  10.Dulcolax suppository 10 mg per rectal p.r.n. if Senna does not      help.  11.Xanax 0.25 mg p.o. q.8h. p.r.n.   DISCHARGE DIAGNOSES:  1. Confusion secondary to hyponatremia.  2. Hyponatremia secondary to hydrochlorothiazide.  3. Hypothyroidism, on Synthroid.  4. Hypertension.  5. Back pain.   DISPOSITION:  Patient will follow up in one week's time for further  management of hypertension and its management.  Also, follow up on serum  sodium levels.   Total time spent on discharge of this patient around 45 minutes.      Theodosia Paling, MD  Electronically Signed     NP/MEDQ  D:  04/11/2008  T:  04/11/2008  Job:  [id number]   cc:   [facility]  Fax: [phone]

## 2010-12-01 NOTE — Op Note (Signed)
[name], [name]                ACCOUNT NO.:  [id number]   MEDICAL RECORD NO.:  [id number]          PATIENT TYPE:  INP   LOCATION:  0098                         [facility]ACILITY:  [facility]   PHYSICIAN:  [name], M.D.DATE OF BIRTH:  [birth year]   DATE OF PROCEDURE:  12/18/2007  DATE OF DISCHARGE:                               OPERATIVE REPORT   SURGEON:  Dr. Darrelyn [name].   ASSISTANT:  Dr. Jene [name].   PREOPERATIVE DIAGNOSES:  1. Severe spinal stenosis at L4-L5.  2. Large herniated disk at L4-5 on the right.   Note, she had severe pain in the right lower extremity only.   OPERATION:  1. Complete decompressive lumbar laminectomy at L4-5 with a total      facetectomy on the left and partial on the right.  2. Microdiskectomy for herniated disk at L4-5 on the right.   PROCEDURE:  Under general anesthesia with the patient on the spinal  frame, she first had 1 g of IV Ancef.  Initial prep of her back was  carried out followed by a sterile prep with DuraPrep.  At this time  after sterile drapings were carried out, two needles were placed in the  back for localization purposes.  X-ray was taken.  Following that,  another x-ray was taken to verify the L4-5 space.  Finally, a third x-  ray was taken to further verify the space.  The incision was made over  the L4-5 region.  Bleeders were identified and cauterized.  I then  stripped the muscle from the lamina bilaterally and the spinous process.  Another, as I mentioned, second x-ray was taken with the instrument in  place.  I then went down and identified the L4 spinous process and went  down and did a complete decompressive lumbar laminectomy.  Another x-ray  was taken to verify the position, and the microscope was brought in.  We  then went down and dissected the ligamentum flavum which was quite  thickened at this area.  We gently removed that while protecting the  dura.  We noticed that the inferior facet on the left was completely  loosened, and so we just removed it and decompressed the lateral recess  area on the left first.  We then went over to the right side and did a  partial facetectomy, removed the thickened ligamentum flavum, and  identified a large herniated disk with a free fragment.  We gently  retracted the dura.  We did a nice foraminotomy first for the root.  We  went down and then made a cruciate incision in the posterior  longitudinal ligament, and a complete diskectomy was carried out.  We  utilized the nerve hook up under the subligamentous space as well as the  Epstein curettes to make sure we had all of the fragments removed.  When  we completed the procedure, we now were easily able to move the root and  the dura.  We had a nice approach with the hockey stick down in the  foramina and went out wide as well and proximally, and there was  no  other abnormality, so we felt good about the decompression.  We  thoroughly irrigated out the areas with antibiotic solution.  I then  injected 10 mL of FloSeal and loosely approximated the wound in the  usual fashion.  Note, a port of the distal and proximal deep parts of  the wound were partially left open for drainage purposes.  Subcutaneous  tissue was closed with 0 Vicryl, skin was closed with metal staples, and  a sterile Neosporin dressing was applied.  She left the operative room  in satisfactory condition.   SURGEON:  Dr. Darrelyn [name].   ASSISTANT:  Dr. Jene [name].           ______________________________  Kendra Lynch. [name], M.D.     RAG/MEDQ  D:  12/18/2007  T:  12/18/2007  Job:  [id number]   cc:   [name], M.D.  Fax: [phone]

## 2010-12-04 NOTE — H&P (Signed)
[name], WEDEKIND                ACCOUNT NO.:  [id number]   MEDICAL RECORD NO.:  [id number]          PATIENT TYPE:  INP   LOCATION:  1442                         [facility]AC[facility]   PHYSICIAN:  Georges Lynch. Gioffre, M.D.DATE OF BIRTH:  11/07/1918   DATE OF ADMISSION:  12/14/2007  DATE OF DISCHARGE:  12/21/2007                              HISTORY & PHYSICAL   CHIEF COMPLAINT:  Lumbar right leg pain.   HISTORY OF PRESENT ILLNESS:  The patient is an 77 year old female with  progressively worsening lumbar and right leg pain.  Denied any injury.  Increased pain with activity with bending, sitting, and progressive with  time.  The patient requires increased narcotic use.  She denies any  numbness or tingling of the lower extremities or loss of bowel or  bladder control.   PAST MEDICAL HISTORY:  Hypertension and thyroid.   FAMILY MEDICAL HISTORY:  Noncontributory.   SOCIAL HISTORY:  No tobacco or EtOH use.   ALLERGIES:  DARVOCET, CELEBREX, BEXTRA, XYLOCAINE WITH EPINEPHRINE.   MEDICATIONS:  Levoxyl, lisinopril, Norvasc, hydrochlorothiazide.   REVIEW OF SYSTEMS:  Negative for any shortness of breath, chest pain, no  GI or GU issues.  No fevers, chills.   PHYSICAL EXAM:  This is an elderly-appearing 77 year old female,  conscious, alert and appropriate in the hospital bed,  appears to be  very comfortable, reporting lumbar pain.  HEENT: Head was normocephalic.  No sign of trauma.  NECK:  Good range of motion without any difficulty.  CHEST:  Lungs were clear and equal bilaterally.  HEART:  Regular rate and rhythm.  ABDOMEN:  Soft, nontender.  Bowel sounds present.  LOWER EXTREMITIES:  Within normal limits.  She had good sensation in the  lower extremities, good motor strength.  The patient did have a positive  straight leg raise on the right with lumbar and right buttock pain and  thigh pain.  SKIN:  Intact without any signs of phlebitis or infections.  BREAST, RECTAL AND GU:  Exams  were deferred at this time.   Labs were pending.   IMPRESSION:  1. Increased lumbar pain requiring increased narcotic use.  2. Questionable herniated nucleus pulposus, lumbar on the right.   PLAN:  The patient will be admitted for pain control.  We will set her  up for a CT myelogram, which she was going to have as an outpatient but  was sent to the hospital ER for evaluation due to the patient's  increased pain.  Treatment will depend on the patient's findings on the  CT myelogram.      Jamelle Rushing, P.A.    ______________________________  Georges Lynch. Darrelyn Hillock, M.D.    RWK/MEDQ  D:  01/10/2008  T:  01/10/2008  Job:  [id number]

## 2010-12-04 NOTE — Discharge Summary (Signed)
NAMECHARNICE, ZWILLING                ACCOUNT NO.:  [id number]   MEDICAL RECORD NO.:  [id number]          PATIENT TYPE:  INP   LOCATION:  1442                         FACILITY:  [facility]   PHYSICIAN:  Georges Lynch. Gioffre, M.D.DATE OF BIRTH:  [birth year]   DATE OF ADMISSION:  12/14/2007  DATE OF DISCHARGE:  12/21/2007                               DISCHARGE SUMMARY   ADMISSION DIAGNOSES:  1. Severe lumbar and right leg pain.  2. Hypertension.  3. Thyroid disease.   DISCHARGE DIAGNOSES:  1. Large herniated disc, lumbar at the L4-5 level, with significant      spinal stenosis, with a surgical decompression at L4-5 and a      microdiskectomy at L4-5 on the right, with improvement of her      symptoms.  2. Uncontrolled hypertension.  3. Dementia.  4. Postoperative fall from bed on hospital floor.   HISTORY OF PRESENT ILLNESS:  The patient is an 77 year old female with  progressively worsening lumbar and right leg pain.  The patient was seen  in the office.  The patient was set up for a CT myelogram, but on  presenting for the CT myelogram the patient reported too much pain.  The  patient was sent to the emergency room for evaluation.  At that time,  the patient's pain was brought under control with IV pain medicines.  The patient was admitted for pain control and a CT myelogram for  evaluation of her current issues.   ALLERGIES:  1. DARVOCET.  2. CELEBREX.  3. BEXTRA.  4. LIDOCAINE WITH EPINEPHRINE.   MEDICATIONS ON ADMISSION:  Levoxyl, lisinopril, Norvasc, and  hydrochlorothiazide.   SURGICAL PROCEDURE:  On December 18, 2007, the patient was taken to the O.R.  by Dr. Ranee Gosselin, assisted by Dr. Jene Every.  Under general  anesthesia, the patient underwent a complete decompressive lumbar  laminectomy at L4-5, with total facetectomy on the left and partial on  the right, with a microdiskectomy for large herniated disc at L4-5 on  the right.  The patient tolerated the procedure well.   There were no  complications.  The patient was transferred to the recovery room and  then to the orthopedic floor in good condition.   CONSULTS:  The following routine consults were requested:  Physical  therapy, case management, and pharmacy.   HOSPITAL COURSE:  The patient was admitted to [facility] under  Dr. Jeannetta Ellis care through the emergency room for evaluation of  uncontrolled lumbar and right leg pain.  The patient's pain was brought  under control with IV medications.  The patient was then set up for a CT  myelogram of her lumbar spine which was completed without difficulty.  The patient was found to have some spinal stenosis at the L4-5 level,  with a large herniated disc on the right.  The patient was then set up  for a surgical procedure to decompress the L4-5 level with removing the  disc material on the right.  The patient was managed over the weekend  with pain control.  The patient's pain was well  managed without any  issues over the weekend until surgical time can be arranged.  The  patient was then taken to the O.R., where a decompressive lumbar  laminectomy and facetectomy was performed at the L4-5 level, with a  microdiskectomy at the L4-5 level on the right.  The patient's blood  pressure around that time was found to be elevated.  It was brought  under control with p.o. medications during her hospitalization, but her  blood pressures still continued to be uncontrolled.  Dr. Darrelyn Hillock felt  that this should be treated by her primary care physician as an  outpatient upon discharge.  The patient's lumbar wound remained benign  for any signs of infection.  It remained intact with staples.  The  patient had significant improvement in pain control.  She was able to  get off IV medications and p.o. medications well.  The patient's right  and left lower extremity were neurologically intact.  The patient was  able to proceed with physical therapy.  Initially, the  patient's family  was requesting the patient to go to a skilled nursing facility, but they  changed their minds and decided to take her home for outpatient  recovery.  The patient did take fall.  She was reportedly found on the  floor one morning postop.  The patient indicated she slipped off the  edge of the bed and landed on the floor.  There was no injury, and the  patient remained neurologically intact.  Her wound remained also intact.  It was felt that the patient was stable and ready for discharge home  after a brief course of physical therapy on the floor and postop care,  so arrangements were made for outpatient care, and she was discharged in  good condition, with follow-up recommendations for her primary care  physician for blood pressure control and with Dr. Darrelyn Hillock for postop  care.   LABORATORIES:  CBC on admission found WBCs 9.4, hemoglobin 13.5,  hematocrit 38.2, with platelets 282.  On discharge, her hemoglobin did  drop to 10.7 on December 20, 2007, but this was rechecked the following day  and was found to be probably dilutional, because her hemoglobin improved  to 13 and 37.2 without any transfusions,  and the patient did not have  any untoward events with this.  Routine chemistries on date of discharge  found sodium of 138, potassium of 4, glucose 100, BUN 16, creatinine  1.05, estimated GFR was at 50.  EKG on admission found normal sinus  rhythm at 76 beats per minute.  CT myelogram found large a disc  protrusion on the right L4-5, with severe spinal stenosis.  A small  atrophic left kidney, likely due to renal vascular stenosis.   DISCHARGE INSTRUCTIONS:   DIET:  No restrictions.   ACTIVITY:  The patient is to slowly increase activity.  No bending,  stooping, or lifting at the waist.   WOUND CARE:  The patient is to change dressing daily.   FOLLOWUP:  1. She needs a follow-up appoint with Dr. Darrelyn Hillock in 2 weeks from date      of surgery.  The patient is to call  [phone] for that follow-up      appointment.  2. The patient is to call her primary care physician for followup      about her blood pressure.  3. [facility] for home health outpatient therapist and nurse.   MEDICATIONS:  1. Percocet 5/325, 1 or 2 tablets q.4-6  h. for pain if needed.  2. Robaxin 500 mg once q.6 h. for pain if needed.  3. The patient is to increase her Norvasc to 10 mg a day unless      changed by primary care physician.  4. Levothyroxine 75 mcg once a day.  5. Magnesium citrate as needed.  6. Lisinopril 80 mg a day.  7. Hydrochlorothiazide 12.5 mg a day.   CONDITION UPON DISCHARGE HOME:  Listed as improved and good.      Jamelle Rushing, P.A.    ______________________________  Georges Lynch Darrelyn Hillock, M.D.    RWK/MEDQ  D:  01/10/2008  T:  01/10/2008  Job:  [id number]

## 2010-12-18 ENCOUNTER — Ambulatory Visit (HOSPITAL_COMMUNITY)
Admission: RE | Admit: 2010-12-18 | Discharge: 2010-12-18 | Disposition: A | Discharge status: Home / Self Care | Payer: Medicare PPO | Source: Ambulatory Visit | Attending: Vascular Surgery | Admitting: Vascular Surgery

## 2010-12-18 DIAGNOSIS — J45909 Unspecified asthma, uncomplicated: Secondary | ICD-10-CM | POA: Insufficient documentation

## 2010-12-18 DIAGNOSIS — I701 Atherosclerosis of renal artery: Secondary | ICD-10-CM

## 2010-12-18 DIAGNOSIS — I774 Celiac artery compression syndrome: Secondary | ICD-10-CM | POA: Insufficient documentation

## 2010-12-18 DIAGNOSIS — F0390 Unspecified dementia without behavioral disturbance: Secondary | ICD-10-CM | POA: Insufficient documentation

## 2010-12-18 DIAGNOSIS — Z01810 Encounter for preprocedural cardiovascular examination: Secondary | ICD-10-CM | POA: Insufficient documentation

## 2010-12-18 DIAGNOSIS — Z79899 Other long term (current) drug therapy: Secondary | ICD-10-CM | POA: Insufficient documentation

## 2010-12-18 DIAGNOSIS — I700 Atherosclerosis of aorta: Secondary | ICD-10-CM | POA: Insufficient documentation

## 2010-12-18 DIAGNOSIS — Z01812 Encounter for preprocedural laboratory examination: Secondary | ICD-10-CM | POA: Insufficient documentation

## 2010-12-18 DIAGNOSIS — I150 Renovascular hypertension: Secondary | ICD-10-CM | POA: Insufficient documentation

## 2010-12-18 DIAGNOSIS — IMO0002 Reserved for concepts with insufficient information to code with codable children: Secondary | ICD-10-CM | POA: Insufficient documentation

## 2010-12-18 DIAGNOSIS — E039 Hypothyroidism, unspecified: Secondary | ICD-10-CM | POA: Insufficient documentation

## 2010-12-18 DIAGNOSIS — K551 Chronic vascular disorders of intestine: Secondary | ICD-10-CM | POA: Insufficient documentation

## 2010-12-18 DIAGNOSIS — E7800 Pure hypercholesterolemia, unspecified: Secondary | ICD-10-CM | POA: Insufficient documentation

## 2010-12-18 DIAGNOSIS — I739 Peripheral vascular disease, unspecified: Secondary | ICD-10-CM | POA: Insufficient documentation

## 2010-12-18 DIAGNOSIS — M810 Age-related osteoporosis without current pathological fracture: Secondary | ICD-10-CM | POA: Insufficient documentation

## 2010-12-18 LAB — POCT I-STAT, CHEM 8
BUN: 17 mg/dL (ref 6–23)
Calcium, Ion: 1.14 mmol/L (ref 1.12–1.32)
Chloride: 105 mEq/L (ref 96–112)
Creatinine, Ser: 1.5 mg/dL — ABNORMAL HIGH (ref 0.4–1.2)
Glucose, Bld: 117 mg/dL — ABNORMAL HIGH (ref 70–99)

## 2010-12-24 NOTE — Assessment & Plan Note (Signed)
OFFICE VISIT  SABRINA, KEOUGH DOB:  26-Jun-1919                                       12/22/2010 ZOXWR#:[id number]  Ms. Hollman underwent an abdominal aortogram with renal and mesenteric angiogram on Friday, June 1st.  This showed an infrarenal aortic stenosis with mild right renal artery stenosis, occlusion of her left renal artery but no significant celiac or superior mesenteric artery stenosis.  I do not believe any intervention is necessary for her renal artery stenosis as this is very mild in nature.  Potentially, she could have elevated hypertension from a "pressor kidney" since this is occluded on the left side.  However, considering a left nephrectomy for this patient, who is fairly frail and elderly, I do not believe is in her best interest.  I believe the best option for her at this point would be medical management of her hypertension, shooting for a blood pressure at least of 170 or less as a reasonable option.  She will follow up with me on an as-needed basis.    Janetta Hora. Kaleel Schmieder, MD Electronically Signed  CEF/MEDQ  D:  12/22/2010  T:  12/22/2010  Job:  [id number]  cc:   Bufford Spikes, DO Patient's Son

## 2010-12-25 NOTE — Op Note (Signed)
[name], [name]                ACCOUNT NO.:  [id number]  MEDICAL RECORD NO.:  [id number]           PATIENT TYPE:  O  LOCATION:  SDSC                         FACILITY:  [facility]  PHYSICIAN:  Janetta Hora. Fields, MD  DATE OF BIRTH:  01-Mar-1919  DATE OF PROCEDURE:  12/18/2010 DATE OF DISCHARGE:  12/18/2010                              OPERATIVE REPORT   PROCEDURE:  Aortogram with mesenteric and renal angiogram.  PREOPERATIVE DIAGNOSIS:  Renal artery stenosis, possible mesenteric artery stenosis.  POSTOPERATIVE DIAGNOSIS:  Renal artery stenosis, possible mesenteric artery stenosis.  OPERATIVE DETAILS:  After obtaining informed consent, the patient was taken to the PV Lab.  The patient was placed in supine position on the angio table.  Both groins were prepped and draped in the usual sterile fashion.  Local anesthesia was infiltrated over the right common femoral artery.  An introducer needle was used to cannulate the right common femoral artery and a 0.035 Versacore wire was threaded into the abdominal aorta under fluoroscopic guidance.  Next, a 5-French sheath was placed over the guidewire in the right common femoral artery and this was thoroughly flushed with heparinized saline.  A 5-French pigtail catheter was then placed over the guidewire and this was advanced into the upper portion of the abdominal aorta.  Abdominal aortogram was then obtained first in a lateral projection.  There was brisk flow through the descending abdominal aorta.  The celiac and superior mesenteric arteries filled briskly, but the origins are not well visualized on the lateral view.  There is severe calcification of the abdominal aorta which begins at the level of the renal arteries and below the mesenteric arteries.  This produces a 60% stenosis which is best visualized on the lateral view.  However, the stenosis appears to be primarily below the level of the renal arteries.  Next, an AP aortogram was  obtained.  This shows the left renal artery is occluded.  The right renal origin has a 30% stenosis and again, the major calcified portion of the aortic lumen does not appear to encroach on the proximal right renal artery.  There again is diffuse calcific atherosclerotic plaque throughout the entire infrarenal abdominal aorta.  A RAO projection was then obtained to better visualize the origin of the right renal artery and again this confirms a 30% stenosis of the right renal origin with occlusion of the left renal artery.  The origins of the superior mesenteric and celiac arteries were well visualized on the oblique view.  There is approximately 20% stenosis of both of these vessels in this view.  Next, the 5-French pigtail catheter was removed over the Versacore wire and a 5-French straight catheter brought up in the operative field and pullback pressures were measured from the level of the diaphragm down into the right iliac system.  There was a 20-mm pressure gradient drop at the level of the aortic stenosis just below the level of the renal arteries.  There was an additional 10-mm pressure drop into the right iliac system, suggesting that the aortic stenosis was flow limiting. However, I did not believe that this is necessarily  compromising flow to the right renal artery.  Next, the 5-French sheath was thoroughly flushed with heparinized saline and this was left in place to be pulled in the holding area.  The patient tolerated the procedure well, and there were no complications. The patient was taken to holding area in stable condition.  OPERATIVE FINDINGS: 1. 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 2. A 30% stenosis of proximal superior mesenteric artery. 3. A 30% stenosis of right renal artery origin. 4. Occlusion of left renal artery. 5. Calcific stenosis of infrarenal aorta occurring just below the     renal arteries with 60% stenosis of the aorta and 20-mm pressure      gradient. 6. A 10-mm pressure gradient in the right iliac system.  The patient does not have a flow-limiting right renal artery stenosis, so no intervention was performed.  The patient does have significant hypertension and this will need to be managed medically.  I did not believe that any intervention from a percutaneous standpoint should be considered for the aortic stenosis as the risk of potential injury to her calcified aorta did not outweigh the benefit.  The patient will follow up on an as-needed basis.     Janetta Hora. Fields, MD     CEF/MEDQ  D:  12/18/2010  T:  12/19/2010  Job:  [id number]  Electronically Signed by Fabienne Bruns MD on 12/25/2010 04:54:09 AM

## 2011-04-06 ENCOUNTER — Encounter: Payer: Self-pay | Admitting: Vascular Surgery

## 2011-04-14 LAB — CBC
HCT: 38.2
MCV: 86.2
Platelets: 262
Platelets: 282
RDW: 13.6
WBC: 7.7
WBC: 9.4

## 2011-04-14 LAB — COMPREHENSIVE METABOLIC PANEL
AST: 18
Albumin: 2.9 — ABNORMAL LOW
Albumin: 3.4 — ABNORMAL LOW
Alkaline Phosphatase: 47
BUN: 13
Chloride: 100
Chloride: 97
Creatinine, Ser: 1.12
GFR calc Af Amer: 54 — ABNORMAL LOW
Potassium: 3.6
Sodium: 134 — ABNORMAL LOW
Total Bilirubin: 0.7
Total Bilirubin: 0.8

## 2011-04-14 LAB — DIFFERENTIAL
Basophils Absolute: 0
Lymphocytes Relative: 12
Monocytes Absolute: 0.3
Neutro Abs: 7.8 — ABNORMAL HIGH

## 2011-04-14 LAB — PROTIME-INR
INR: 0.9
Prothrombin Time: 12

## 2011-04-14 LAB — APTT: aPTT: 27

## 2011-04-15 LAB — BASIC METABOLIC PANEL
Chloride: 101
Creatinine, Ser: 1.05

## 2011-04-15 LAB — CBC
Hemoglobin: 11.7 — ABNORMAL LOW
MCV: 87
RBC: 3.89
WBC: 6.5

## 2011-04-15 LAB — HEMOGLOBIN AND HEMATOCRIT, BLOOD
HCT: 31 — ABNORMAL LOW
HCT: 37.2
Hemoglobin: 10.7 — ABNORMAL LOW

## 2011-04-15 LAB — PROTIME-INR
INR: 0.9
Prothrombin Time: 12.8

## 2011-04-19 LAB — URINALYSIS, ROUTINE W REFLEX MICROSCOPIC
Bilirubin Urine: NEGATIVE
Glucose, UA: NEGATIVE
Glucose, UA: NEGATIVE
Ketones, ur: NEGATIVE
Nitrite: NEGATIVE
Protein, ur: 30 — AB
Specific Gravity, Urine: 1.011
pH: 7

## 2011-04-19 LAB — BASIC METABOLIC PANEL
BUN: 11
BUN: 12
BUN: 15
CO2: 25
CO2: 26
Calcium: 9
Calcium: 9.2
Chloride: 102
Chloride: 106
Chloride: 99
Creatinine, Ser: 0.72
Creatinine, Ser: 0.74
GFR calc Af Amer: 59 — ABNORMAL LOW
GFR calc Af Amer: >60
GFR calc Af Amer: >60
GFR calc non Af Amer: 49 — ABNORMAL LOW
GFR calc non Af Amer: 53 — ABNORMAL LOW
GFR calc non Af Amer: >60
GFR calc non Af Amer: >60
Glucose, Bld: 108 — ABNORMAL HIGH
Glucose, Bld: 114 — ABNORMAL HIGH
Glucose, Bld: 161 — ABNORMAL HIGH
Glucose, Bld: 97
Glucose, Bld: 99
Potassium: 3.5
Potassium: 3.9
Potassium: 3.9
Sodium: 130 — ABNORMAL LOW
Sodium: 133 — ABNORMAL LOW
Sodium: 133 — ABNORMAL LOW
Sodium: 136

## 2011-04-19 LAB — COMPREHENSIVE METABOLIC PANEL
ALT: 17
ALT: 17
AST: 20
AST: 21
Albumin: 3.1 — ABNORMAL LOW
Alkaline Phosphatase: 46
CO2: 29
Calcium: 8.4
Chloride: 81 — ABNORMAL LOW
GFR calc Af Amer: >60
GFR calc Af Amer: >60
Potassium: 2 — CL
Sodium: 120 — ABNORMAL LOW
Sodium: 125 — ABNORMAL LOW
Total Bilirubin: 0.5
Total Protein: 5.5 — ABNORMAL LOW

## 2011-04-19 LAB — URINE MICROSCOPIC-ADD ON

## 2011-04-19 LAB — CBC
HCT: 33.1 — ABNORMAL LOW
HCT: 33.8 — ABNORMAL LOW
HCT: 34 — ABNORMAL LOW
HCT: 35.5 — ABNORMAL LOW
Hemoglobin: 11.2 — ABNORMAL LOW
Hemoglobin: 11.3 — ABNORMAL LOW
Hemoglobin: 11.3 — ABNORMAL LOW
Hemoglobin: 11.9 — ABNORMAL LOW
MCHC: 33.6
MCHC: 34.1
MCV: 88.4
MCV: 89.2
Platelets: 341
Platelets: 347
RBC: 3.79 — ABNORMAL LOW
RBC: 3.8 — ABNORMAL LOW
RBC: 3.89
RBC: 4.07
RDW: 13.5
WBC: 10
WBC: 12.5 — ABNORMAL HIGH
WBC: 6.9
WBC: 9.8

## 2011-04-19 LAB — URINE CULTURE
Colony Count: >=100000
Special Requests: POSITIVE

## 2011-04-19 LAB — VITAMIN B12: Vitamin B-12: 445 (ref 211–911)

## 2011-04-19 LAB — POTASSIUM: Potassium: 3.2 — ABNORMAL LOW

## 2011-04-23 LAB — COMPREHENSIVE METABOLIC PANEL
ALT: 51 U/L — ABNORMAL HIGH (ref 0–35)
AST: 32 U/L (ref 0–37)
Albumin: 3.2 g/dL — ABNORMAL LOW (ref 3.5–5.2)
Alkaline Phosphatase: 51 U/L (ref 39–117)
BUN: 18 mg/dL (ref 6–23)
CO2: 25 mEq/L (ref 19–32)
Calcium: 9 mg/dL (ref 8.4–10.5)
Chloride: 107 mEq/L (ref 96–112)
GFR calc Af Amer: 55 mL/min — ABNORMAL LOW (ref >60)
GFR calc non Af Amer: 42 mL/min — ABNORMAL LOW (ref >60)
Glucose, Bld: 97 mg/dL (ref 70–99)
Glucose, Bld: 98 mg/dL (ref 70–99)
Potassium: 4.1 mEq/L (ref 3.5–5.1)
Sodium: 134 mEq/L — ABNORMAL LOW (ref 135–145)
Total Bilirubin: 0.6 mg/dL (ref 0.3–1.2)
Total Protein: 4.9 g/dL — ABNORMAL LOW (ref 6.0–8.3)
Total Protein: 5.4 g/dL — ABNORMAL LOW (ref 6.0–8.3)

## 2011-04-23 LAB — TSH: TSH: 3.03 u[IU]/mL (ref 0.350–4.500)

## 2011-04-23 LAB — MAGNESIUM: Magnesium: 2.4 mg/dL (ref 1.5–2.5)

## 2011-04-23 LAB — CK TOTAL AND CKMB (NOT AT ARMC)
CK, MB: 1.4 ng/mL (ref 0.3–4.0)
CK, MB: 1.5 ng/mL (ref 0.3–4.0)
CK, MB: 1.8 ng/mL (ref 0.3–4.0)
Relative Index: INVALID (ref 0.0–2.5)
Relative Index: INVALID (ref 0.0–2.5)
Total CK: 35 U/L (ref 7–177)
Total CK: 41 U/L (ref 7–177)

## 2011-04-23 LAB — TROPONIN I
Troponin I: 0.01 ng/mL (ref 0.00–0.06)
Troponin I: 0.01 ng/mL (ref 0.00–0.06)

## 2011-04-23 LAB — CBC
HCT: 32.2 % — ABNORMAL LOW (ref 36.0–46.0)
Hemoglobin: 10.7 g/dL — ABNORMAL LOW (ref 12.0–15.0)
Hemoglobin: 10.9 g/dL — ABNORMAL LOW (ref 12.0–15.0)
MCHC: 33.2 g/dL (ref 30.0–36.0)
MCHC: 33.7 g/dL (ref 30.0–36.0)
MCV: 90.3 fL (ref 78.0–100.0)
Platelets: 242 10*3/uL (ref 150–400)
Platelets: 258 10*3/uL (ref 150–400)
Platelets: 306 10*3/uL (ref 150–400)
RBC: 3.57 MIL/uL — ABNORMAL LOW (ref 3.87–5.11)
RDW: 15.8 % — ABNORMAL HIGH (ref 11.5–15.5)
RDW: 15.9 % — ABNORMAL HIGH (ref 11.5–15.5)
RDW: 16.1 % — ABNORMAL HIGH (ref 11.5–15.5)
WBC: 11.7 10*3/uL — ABNORMAL HIGH (ref 4.0–10.5)
WBC: 7.1 10*3/uL (ref 4.0–10.5)

## 2011-04-23 LAB — POCT CARDIAC MARKERS: Troponin i, poc: <0.05 ng/mL (ref 0.00–0.09)

## 2011-04-23 LAB — DIFFERENTIAL
Basophils Absolute: 0.1 10*3/uL (ref 0.0–0.1)
Basophils Relative: 1 % (ref 0–1)
Eosinophils Absolute: 0 10*3/uL (ref 0.0–0.7)
Eosinophils Relative: 0 % (ref 0–5)
Lymphocytes Relative: 13 % (ref 12–46)
Lymphs Abs: 1.5 K/uL (ref 0.7–4.0)
Monocytes Absolute: 0.7 K/uL (ref 0.1–1.0)
Monocytes Relative: 6 % (ref 3–12)
Neutro Abs: 9.4 K/uL — ABNORMAL HIGH (ref 1.7–7.7)
Neutrophils Relative %: 80 % — ABNORMAL HIGH (ref 43–77)

## 2011-04-23 LAB — BASIC METABOLIC PANEL
BUN: 22 mg/dL (ref 6–23)
Calcium: 8.7 mg/dL (ref 8.4–10.5)
Chloride: 104 mEq/L (ref 96–112)
Creatinine, Ser: 1.57 mg/dL — ABNORMAL HIGH (ref 0.4–1.2)
GFR calc non Af Amer: 31 mL/min — ABNORMAL LOW (ref >60)
GFR calc non Af Amer: 45 mL/min — ABNORMAL LOW (ref >60)
Glucose, Bld: 102 mg/dL — ABNORMAL HIGH (ref 70–99)
Glucose, Bld: 112 mg/dL — ABNORMAL HIGH (ref 70–99)
Potassium: 5.9 mEq/L — ABNORMAL HIGH (ref 3.5–5.1)
Sodium: 136 mEq/L (ref 135–145)

## 2011-04-23 LAB — URINALYSIS, ROUTINE W REFLEX MICROSCOPIC
Glucose, UA: NEGATIVE mg/dL
Hgb urine dipstick: NEGATIVE
Ketones, ur: 15 mg/dL — AB
Leukocytes, UA: NEGATIVE
Nitrite: NEGATIVE
Protein, ur: >300 mg/dL — AB
Specific Gravity, Urine: 1.027 (ref 1.005–1.030)
Urobilinogen, UA: 1 mg/dL (ref 0.0–1.0)
pH: 5.5 (ref 5.0–8.0)

## 2011-04-23 LAB — BASIC METABOLIC PANEL WITH GFR
CO2: 22 meq/L (ref 19–32)
Calcium: 8.9 mg/dL (ref 8.4–10.5)
GFR calc Af Amer: 38 mL/min — ABNORMAL LOW (ref >60)
Sodium: 133 meq/L — ABNORMAL LOW (ref 135–145)

## 2011-04-23 LAB — URINE MICROSCOPIC-ADD ON

## 2011-04-23 LAB — APTT: aPTT: 28 s (ref 24–37)

## 2011-04-23 LAB — FERRITIN: Ferritin: 74 ng/mL (ref 10–291)

## 2011-04-23 LAB — IRON AND TIBC
Iron: 63 ug/dL (ref 42–135)
UIBC: 184 ug/dL

## 2011-04-23 LAB — SEDIMENTATION RATE: Sed Rate: 7 mm/hr (ref 0–22)

## 2011-04-23 LAB — PROTIME-INR
INR: 1 (ref 0.00–1.49)
Prothrombin Time: 13.1 seconds (ref 11.6–15.2)

## 2011-04-23 LAB — FOLATE: Folate: 8 ng/mL

## 2011-04-23 LAB — POTASSIUM: Potassium: 5 meq/L (ref 3.5–5.1)

## 2011-05-13 ENCOUNTER — Ambulatory Visit: Payer: Medicare PPO | Admitting: Vascular Surgery

## 2011-05-13 ENCOUNTER — Other Ambulatory Visit: Payer: Medicare PPO

## 2011-05-20 ENCOUNTER — Other Ambulatory Visit: Payer: Medicare PPO

## 2011-05-20 ENCOUNTER — Ambulatory Visit: Payer: Medicare PPO | Admitting: Vascular Surgery

## 2011-05-26 ENCOUNTER — Encounter: Payer: Self-pay | Admitting: Vascular Surgery

## 2011-05-27 ENCOUNTER — Other Ambulatory Visit (INDEPENDENT_AMBULATORY_CARE_PROVIDER_SITE_OTHER): Payer: Medicare PPO | Admitting: *Deleted

## 2011-05-27 ENCOUNTER — Encounter: Payer: Self-pay | Admitting: Vascular Surgery

## 2011-05-27 ENCOUNTER — Ambulatory Visit (INDEPENDENT_AMBULATORY_CARE_PROVIDER_SITE_OTHER): Payer: Medicare PPO | Admitting: Vascular Surgery

## 2011-05-27 ENCOUNTER — Encounter (INDEPENDENT_AMBULATORY_CARE_PROVIDER_SITE_OTHER): Payer: Medicare PPO | Admitting: *Deleted

## 2011-05-27 VITALS — BP 179/71 | HR 73 | Resp 12 | Ht 63.0 in | Wt 106.0 lb

## 2011-05-27 DIAGNOSIS — I739 Peripheral vascular disease, unspecified: Secondary | ICD-10-CM

## 2011-05-27 DIAGNOSIS — I6529 Occlusion and stenosis of unspecified carotid artery: Secondary | ICD-10-CM

## 2011-05-27 DIAGNOSIS — I7092 Chronic total occlusion of artery of the extremities: Secondary | ICD-10-CM

## 2011-05-27 NOTE — Progress Notes (Signed)
[facility] [place] HISTORY AND PHYSICAL   History of Present Illness:  Patient is a 77 y.o. year old female who presents for follow-up evaluation for carotid stenosis.  She is on Aspirin for antiplatelet therapy.  His atherosclerotic risk factors include elevated cholesterol, hypertension.  She has known occlusion of one renal artery as well as mild contralateral renal stenosis.  She is on 4 BP meds with reasonable control.  These are all currently stable and followed by his primary care physician.  She denies any new neurologic events including amaurosis, numbness, or weakness.  She also has a history of peripheral arterial disease and returns today for followup regarding this. She does not really walk that much but shuffles around her assisted-living facility. She denies claudication or rest pain. She has no history of nonhealing ulcers on her feet.  Past Medical History  Diagnosis Date  . Hyperlipidemia   . Hypertension   . Arthritis   . Anxiety   . Asthma   . Leg pain     with walking  . Peripheral arterial disease   . Dementia     Past Surgical History  Procedure Date  . Back surgery     Review of Systems:  Neurologic: as above Cardiac:denies shortness of breath or chest pain Pulmonary: denies cough or wheeze  Social History History  Substance Use Topics  . Smoking status: Former Smoker    Types: Cigarettes    Quit date: 07/19/1957  . Smokeless tobacco: Not on file  . Alcohol Use: No    Allergies  Allergies  Allergen Reactions  . Celebrex (Celecoxib)   . Epinephrine   . Lidocaine   . Lyrica (Pregabalin)   . Morphine And Related   . Oxycodone   . Sulfa Antibiotics   . Ultram (Tramadol Hcl)      Current Outpatient Prescriptions  Medication Sig Dispense Refill  . ALPRAZolam (XANAX) 0.25 MG tablet Take 0.25 mg by mouth at bedtime as needed.        Marland Kitchen amLODipine (NORVASC) 10 MG tablet Take 5 mg by mouth daily.       Marland Kitchen aspirin EC 81 MG  tablet Take 81 mg by mouth daily.        . cloNIDine (CATAPRES) 0.3 MG tablet Take 0.3 mg by mouth 2 (two) times daily.        Marland Kitchen donepezil (ARICEPT) 10 MG tablet Take 10 mg by mouth at bedtime as needed.       . fluticasone (VERAMYST) 27.5 MCG/SPRAY nasal spray Place 2 sprays into the nose 2 (two) times daily as needed.        . hydrALAZINE (APRESOLINE) 25 MG tablet Take 50 mg by mouth 4 (four) times daily.       Marland Kitchen levothyroxine (SYNTHROID, LEVOTHROID) 75 MCG tablet Take 75 mcg by mouth daily.        Marland Kitchen oxyCODONE-acetaminophen (PERCOCET) 10-325 MG per tablet Take 1 tablet by mouth every 8 (eight) hours as needed.        . valsartan (DIOVAN) 320 MG tablet Take 320 mg by mouth daily.        . Alum & Mag Hydroxide-Simeth (MAGIC MOUTHWASH) SOLN Take by mouth.        . celecoxib (CELEBREX) 200 MG capsule Take 200 mg by mouth daily.        . Diclofenac Sodium (PENNSAID) 1.5 % SOLN Place onto the skin.        Marland Kitchen gabapentin (NEURONTIN) 100  MG capsule Take 100 mg by mouth 3 (three) times daily.        Marland Kitchen MAGNESIUM CITRATE PO Take by mouth as needed.        . trospium (SANCTURA) 20 MG tablet Take 20 mg by mouth 2 (two) times daily.          Physical Examination  Filed Vitals:   05/27/11 1609  BP: 179/71  Pulse: 73  Resp: 12  Height: 5\' 3"  (1.6 m)  Weight: 106 lb (48.081 kg)  SpO2: 98%    Body mass index is 18.78 kg/(m^2).  General:  Alert and oriented, no acute distress HEENT: Normal Neck: No bruit or JVD Pulmonary: Clear to auscultation bilaterally Cardiac: Regular Rate and Rhythm without murmur Neurologic: Upper and lower extremity motor 5/5 and symmetric Extremities: 2+ femoral pulses bilaterally with absent popliteal and pedal pulses  DATA: She had a carotid duplex scan today which I reviewed and interpreted. This shows a right carotid stenosis of 40-60% with less than 40% left internal carotid artery stenosis she did have retrograde flow in her right vertebral artery but with and a  grade left vertebral artery flow. She also had bilateral ABIs which are 0.57 on the right 0.56 on the left with monophasic to biphasic waveforms   ASSESSMENT: #1 stable moderate right carotid stenosis asymptomatic  #2 stable PAD with no symptoms currently   PLAN: Continued observation of her carotid stenosis and repeat carotid duplex exam in 6 months time. We will continue to get intermittent duplex scans on her to make sure that she is not progressing. I do not relation needs further ABIs at this point since she is asymptomatic.  Fabienne Bruns, MD [facility] [place] Office: [phone] Pager: [phone]

## 2011-06-04 NOTE — Procedures (Unsigned)
CAROTID DUPLEX EXAM  INDICATION:  Follow up carotid disease.  HISTORY: Diabetes:  No. Cardiac: Hypertension:  Yes. Smoking:  Previous. Previous Surgery:  No. CV History: Amaurosis Fugax No, Paresthesias No, Hemiparesis No.                                      RIGHT             LEFT Brachial systolic pressure:         168               171 Brachial Doppler waveforms:         WNL               WNL Vertebral direction of flow:        Retrograde        Antegrade DUPLEX VELOCITIES (cm/sec) CCA peak systolic                   78                82 ECA peak systolic                   177               166 ICA peak systolic                   177               126 ICA end diastolic                   42                32 PLAQUE MORPHOLOGY:                  Heterogenous      Heterogenous PLAQUE AMOUNT:                      Mild-to-moderate  Mild PLAQUE LOCATION:                    CCA/ECA/ICA       CCA/ECA/ICA  IMPRESSION: 1. 40% to 59% right internal carotid artery stenosis. 2. 1% to 39% left internal carotid artery stenosis. 3. Vertebral artery appears retrograde with collateralization. 4. Left vertebral artery is within normal limits.  ___________________________________________ Janetta Hora Fields, MD  LT/MEDQ  D:  05/27/2011  T:  05/27/2011  Job:  [id number]

## 2011-10-28 ENCOUNTER — Telehealth: Payer: Self-pay | Admitting: Vascular Surgery

## 2011-10-28 NOTE — Telephone Encounter (Signed)
Patients son, Hardie Lora called and left a message stating that Ms Eischen is now in [facility] and would not be keeping her protocol appointments for her carotid disease. I called Mr Mikey Bussing back and confirmed that I had canceled her appointment and I would notify Dr Darrick Penna of the situation. Routing to [facility], [employer] and [employer]

## 2011-11-25 ENCOUNTER — Other Ambulatory Visit: Payer: Medicare PPO

## 2011-11-30 ENCOUNTER — Emergency Department (HOSPITAL_COMMUNITY): Admission: EM | Admit: 2011-11-30 | Payer: Medicare PPO | Source: Home / Self Care

## 2011-11-30 ENCOUNTER — Emergency Department (HOSPITAL_BASED_OUTPATIENT_CLINIC_OR_DEPARTMENT_OTHER)
Admission: EM | Admit: 2011-11-30 | Discharge: 2011-11-30 | Disposition: A | Discharge status: Home / Self Care | Payer: Medicare Other | Attending: Emergency Medicine | Admitting: Emergency Medicine

## 2011-11-30 DIAGNOSIS — F0390 Unspecified dementia without behavioral disturbance: Secondary | ICD-10-CM | POA: Insufficient documentation

## 2011-11-30 DIAGNOSIS — Z79899 Other long term (current) drug therapy: Secondary | ICD-10-CM | POA: Insufficient documentation

## 2011-11-30 DIAGNOSIS — Z0000 Encounter for general adult medical examination without abnormal findings: Secondary | ICD-10-CM | POA: Insufficient documentation

## 2011-11-30 DIAGNOSIS — E785 Hyperlipidemia, unspecified: Secondary | ICD-10-CM | POA: Insufficient documentation

## 2011-11-30 DIAGNOSIS — R109 Unspecified abdominal pain: Secondary | ICD-10-CM | POA: Insufficient documentation

## 2011-11-30 DIAGNOSIS — I10 Essential (primary) hypertension: Secondary | ICD-10-CM | POA: Insufficient documentation

## 2011-11-30 NOTE — Discharge Instructions (Signed)
Dementia  Dementia is a general term for problems with brain function. A person with dementia has memory loss and a hard time with at least one other brain function such as thinking, speaking, or problem solving. Dementia can affect social functioning, how you do your job, your mood, or your personality. The changes may be hidden for a long time. The earliest forms of this disease are usually not detected by family or friends.  Dementia can be:   Irreversible.   Potentially reversible.   Partially reversible.   Progressive. This means it can get worse over time.  CAUSES   Irreversible dementia causes may include:   Degeneration of brain cells (Alzheimer's disease or lewy body dementia).   Multiple small strokes (vascular dementia).   Infection (chronic meningitis or Creutzfelt-Jakob disease).   Frontotemporal dementia. This affects younger people, age 40 to 70, compared to those who have Alzheimer's disease.   Dementia associated with other disorders like Parkinson's disease, Huntington's disease, or HIV-associated dementia.  Potentially or partially reversible dementia causes may include:   Medicines.   Metabolic causes such as excessive alcohol intake, vitamin B12 deficiency, or thyroid disease.   Masses or pressure in the brain such as a tumor, blood clot, or hydrocephalus.  SYMPTOMS   Symptoms are often hard to detect. Family members or coworkers may not notice them early in the disease process. Different people with dementia may have different symptoms. Symptoms can include:   A hard time with memory, especially recent memory. Long-term memory may not be impaired.   Asking the same question multiple times or forgetting something someone just said.   A hard time speaking your thoughts or finding certain words.   A hard time solving problems or performing familiar tasks (such as how to use a telephone).   Sudden changes in mood.   Changes in personality, especially increasing moodiness or  mistrust.   Depression.   A hard time understanding complex ideas that were never a problem in the past.  DIAGNOSIS   There are no specific tests for dementia.    Your caregiver may recommend a thorough evaluation. This is because some forms of dementia can be reversible. The evaluation will likely include a physical exam and getting a detailed history from you and a family member. The history often gives the best clues and suggestions for a diagnosis.   Memory testing may be done. A detailed brain function evaluation called neuropsychologic testing may be helpful.   Lab tests and brain imaging (such as a CT scan or MRI scan) are sometimes important.   Sometimes observation and re-evaluation over time is very helpful.  TREATMENT   Treatment depends on the cause.    If the problem is a vitamin deficiency, it may be helped or cured with supplements.   For dementias such as Alzheimer's disease, medicines are available to stabilize or slow the course of the disease. There are no cures for this type of dementia.   Your caregiver can help direct you to groups, organizations, and other caregivers to help with decisions in the care of you or your loved one.  HOME CARE INSTRUCTIONS  The care of individuals with dementia is varied and dependent upon the progression of the dementia. The following suggestions are intended for the person living with, or caring for, the person with dementia.   Create a safe environment.   Remove the locks on bathroom doors to prevent the person from accidentally locking himself or herself in.     Use childproof latches on kitchen cabinets and any place where cleaning supplies, chemicals, or alcohol are kept.   Use childproof covers in unused electrical outlets.   Install childproof devices to keep doors and windows secured.   Remove stove knobs or install safety knobs and an automatic shut-off on the stove.   Lower the temperature on water heaters.   Label medicines and keep them  locked up.   Secure knives, lighters, matches, power tools, and guns, and keep these items out of reach.   Keep the house free from clutter. Remove rugs or anything that might contribute to a fall.   Remove objects that might break and hurt the person.   Make sure lighting is good, both inside and outside.   Install grab rails as needed.   Use a monitoring device to alert you to falls or other needs for help.   Reduce confusion.   Keep familiar objects and people around.   Use night lights or dim lights at night.   Label items or areas.   Use reminders, notes, or directions for daily activities or tasks.   Keep a simple, consistent routine for waking, meals, bathing, dressing, and bedtime.   Create a calm, quiet environment.   Place large clocks and calendars prominently.   Display emergency numbers and home address near all telephones.   Use cues to establish different times of the day. An example is to open curtains to let the natural light in during the day.    Use effective communication.   Choose simple words and short sentences.   Use a gentle, calm tone of voice.   Be careful not to interrupt.   If the person is struggling to find a word or communicate a thought, try to provide the word or thought.   Ask one question at a time. Allow the person ample time to answer questions. Repeat the question again if the person does not respond.   Reduce nighttime restlessness.   Provide a comfortable bed.   Have a consistent nighttime routine.   Ensure a regular walking or physical activity schedule. Involve the person in daily activities as much as possible.   Limit napping during the day.   Limit caffeine.   Attend social events that stimulate rather than overwhelm the senses.   Encourage good nutrition and hydration.   Reduce distractions during meal times and snacks.   Avoid foods that are too hot or too cold.   Monitor chewing and swallowing ability.   Continue with routine vision,  hearing, dental, and medical screenings.   Only give over-the-counter or prescription medicines as directed by the caregiver.   Monitor driving abilities. Do not allow the person to drive when safe driving is no longer possible.   Register with an identification program which could provide location assistance in the event of a missing person situation.  SEEK MEDICAL CARE IF:    New behavioral problems start such as moodiness, aggressiveness, or seeing things that are not there (hallucinations).   Any new problem with brain function happens. This includes problems with balance, speech, or falling a lot.   Problems with swallowing develop.   Any symptoms of other illness happen.  Small changes or worsening in any aspect of brain function can be a sign that the illness is getting worse. It can also be a sign of another medical illness such as infection. Seeing a caregiver right away is important.  SEEK IMMEDIATE MEDICAL CARE IF:      A fever develops.   New or worsened confusion develops.   New or worsened sleepiness develops.   Staying awake becomes hard to do.  Document Released: 12/29/2000 Document Revised: 06/24/2011 Document Reviewed: 11/30/2010  [employer] Patient Information 2012 [employer].

## 2011-11-30 NOTE — ED Provider Notes (Signed)
History     CSN: [id number]  Arrival date & time 11/30/11  1744   First MD Initiated Contact with Patient 11/30/11 1746      No chief complaint on file.   (Consider location/radiation/quality/duration/timing/severity/associated sxs/prior treatment) The history is provided by the EMS personnel, a caregiver and a relative. No language interpreter was used.   78 year old female with history of advanced dementia presents via EMS. She lives with a caregiver. She walked to her neighbors house and was complaining of generalized illness. EMS was called. She was transported against her caregivers wishes. She is currently on hospice and has DO NOT RESUSCITATE orders. She receives medications by hospice. Her son who is a power of attorney wishes nothing to be performed  Past Medical History  Diagnosis Date  . Hyperlipidemia   . Hypertension   . Arthritis   . Anxiety   . Asthma   . Leg pain     with walking  . Peripheral arterial disease   . Dementia     Past Surgical History  Procedure Date  . Back surgery     Family History  Problem Relation Age of Onset  . Heart disease Mother   . Heart disease Brother     History  Substance Use Topics  . Smoking status: Former Smoker    Types: Cigarettes    Quit date: 07/19/1957  . Smokeless tobacco: Not on file  . Alcohol Use: No    OB History    Grav Para Term Preterm Abortions TAB SAB Ect Mult Living                  Review of Systems  Unable to perform ROS: Dementia    Allergies  Celebrex; Epinephrine; Lidocaine; Lyrica; Morphine and related; Oxycodone; Sulfa antibiotics; and Ultram  Home Medications   Current Outpatient Rx  Name Route Sig Dispense Refill  . ALPRAZOLAM 0.25 MG PO TABS Oral Take 0.25 mg by mouth at bedtime as needed.      Marland Kitchen MAGIC MOUTHWASH Oral Take by mouth.      . AMLODIPINE BESYLATE 10 MG PO TABS Oral Take 5 mg by mouth daily.     . ASPIRIN EC 81 MG PO TBEC Oral Take 81 mg by mouth daily.      .  CELECOXIB 200 MG PO CAPS Oral Take 200 mg by mouth daily.      Marland Kitchen CLONIDINE HCL 0.3 MG PO TABS Oral Take 0.3 mg by mouth 2 (two) times daily.      Marland Kitchen DICLOFENAC SODIUM 1.5 % TD SOLN Transdermal Place onto the skin.      . DONEPEZIL HCL 10 MG PO TABS Oral Take 10 mg by mouth at bedtime as needed.     Marland Kitchen FLUTICASONE FUROATE 27.5 MCG/SPRAY NA SUSP Nasal Place 2 sprays into the nose 2 (two) times daily as needed.      Marland Kitchen GABAPENTIN 100 MG PO CAPS Oral Take 100 mg by mouth 3 (three) times daily.      Marland Kitchen HYDRALAZINE HCL 25 MG PO TABS Oral Take 50 mg by mouth 4 (four) times daily.     Marland Kitchen LEVOTHYROXINE SODIUM 75 MCG PO TABS Oral Take 75 mcg by mouth daily.      Marland Kitchen MAGNESIUM CITRATE PO Oral Take by mouth as needed.      . OXYCODONE-ACETAMINOPHEN 10-325 MG PO TABS Oral Take 1 tablet by mouth every 8 (eight) hours as needed.      . TROSPIUM CHLORIDE  20 MG PO TABS Oral Take 20 mg by mouth 2 (two) times daily.      Marland Kitchen VALSARTAN 320 MG PO TABS Oral Take 320 mg by mouth daily.        There were no vitals taken for this visit.  Physical Exam  Nursing note and vitals reviewed. Constitutional: She appears well-developed and well-nourished. No distress.  HENT:  Head: Normocephalic and atraumatic.  Mouth/Throat: Oropharynx is clear and moist. No oropharyngeal exudate.  Eyes: Conjunctivae and EOM are normal. Pupils are equal, round, and reactive to light.  Neck: Normal range of motion. Neck supple.  Cardiovascular: Normal rate, regular rhythm, normal heart sounds and intact distal pulses.  Exam reveals no gallop and no friction rub.   No murmur heard. Pulmonary/Chest: Effort normal and breath sounds normal. No respiratory distress. She exhibits no tenderness.  Abdominal: Soft. Bowel sounds are normal. She exhibits no distension. There is Tenderness: diffuse - mild.. There is no rebound and no guarding.  Musculoskeletal: She exhibits no edema.  Neurological:       Confused at patient baseline  Skin: Skin is warm  and dry.    ED Course  Procedures (including critical care time)  Labs Reviewed - No data to display No results found.   1. Examination for, medical, general       MDM  The patient was brought to the emergency department by EMS against her caregivers wishes. Patient has advanced dementia and is unable to care for herself. She has DO NOT RESUSCITATE and is a hospice patient. The patient's son wishes that nothing be performed in the emergency department and any medical screening evaluation be completed. He wishes to have no blood work, imaging, urinalysis performed. I will oblige by these wishes        Dayton Bailiff, MD 11/30/11 [id number]

## 2011-11-30 NOTE — ED Notes (Signed)
Pt in via EMS from residence with c/o abdominal pain. Per conversation with pt's son, pt is hospice pt and he has asked that MSE be performed and no other tests or interventions due to the fact she has a DNR r/t advanced dementia. Per son, pt is living with the caregiver who has POA and while caregiver was out, another person called EMS against family wishes. Pt is verbal but unable to participate in self care or history. Son on way to get mother and drive her home.

## 2011-11-30 NOTE — ED Notes (Signed)
Pt in via EMS from residence with c/o abdominal pain. Per conversation with pt's son Dr. Margarita Mail, pt is hospice pt and he has asked that MSE be performed and no other tests or interventions due to the fact she has a DNR r/t advanced dementia. Per son, pt is living with the caregiver who has POA and while caregiver was out, another person called EMS against family wishes. Pt is verbal but unable to participate in self care or history. Son on way to get mother and drive her home.

## 2011-11-30 NOTE — ED Notes (Signed)
MD at bedside with patient. Son, Dr. Margarita Mail present and verbalized understanding for MSE and wants to sign patient out after AMA.

## 2012-05-25 ENCOUNTER — Other Ambulatory Visit: Payer: Medicare PPO

## 2012-06-06 ENCOUNTER — Other Ambulatory Visit: Payer: Self-pay | Admitting: Nurse Practitioner

## 2012-06-06 ENCOUNTER — Ambulatory Visit
Admission: RE | Admit: 2012-06-06 | Discharge: 2012-06-06 | Disposition: A | Discharge status: Home / Self Care | Payer: Medicare Other | Source: Ambulatory Visit | Attending: Nurse Practitioner | Admitting: Nurse Practitioner

## 2012-06-06 DIAGNOSIS — R52 Pain, unspecified: Secondary | ICD-10-CM

## 2012-10-04 ENCOUNTER — Telehealth: Payer: Self-pay | Admitting: *Deleted

## 2012-10-04 NOTE — Telephone Encounter (Signed)
[phone]. Darl Pikes called and stated that patient has pitting edema bilateral lower legs for three days. I called caregiver to make an appointment and she gave me patient son(philip hoffman) phone number. [phone] cell.  I made an appointment

## 2012-10-04 NOTE — Telephone Encounter (Signed)
They would like to extend patient visit with for nine weeks (wound care on ankle). I gave a verbal order that was fine

## 2012-10-07 DIAGNOSIS — L8993 Pressure ulcer of unspecified site, stage 3: Secondary | ICD-10-CM

## 2012-10-07 DIAGNOSIS — L89899 Pressure ulcer of other site, unspecified stage: Secondary | ICD-10-CM

## 2012-10-07 DIAGNOSIS — L8995 Pressure ulcer of unspecified site, unstageable: Secondary | ICD-10-CM

## 2012-10-09 ENCOUNTER — Ambulatory Visit: Payer: Self-pay | Admitting: Internal Medicine

## 2012-10-09 NOTE — Telephone Encounter (Signed)
Per Suzette Battiest, patient son called and canceled patient's appointment. He stated that she did not needed

## 2012-10-26 ENCOUNTER — Other Ambulatory Visit: Payer: Self-pay | Admitting: *Deleted

## 2012-10-26 MED ORDER — OXYCODONE HCL 5 MG PO TABS: ORAL_TABLET | ORAL | Status: DC

## 2012-10-26 MED ORDER — ALPRAZOLAM 0.25 MG PO TABS: ORAL_TABLET | ORAL | Status: DC

## 2012-11-16 ENCOUNTER — Ambulatory Visit (INDEPENDENT_AMBULATORY_CARE_PROVIDER_SITE_OTHER): Payer: Medicare Other | Admitting: Internal Medicine

## 2012-11-16 ENCOUNTER — Encounter: Payer: Self-pay | Admitting: Internal Medicine

## 2012-11-16 VITALS — BP 134/82 | HR 63 | Temp 97.9°F | Resp 14 | Ht 63.0 in | Wt 94.8 lb

## 2012-11-16 DIAGNOSIS — L8993 Pressure ulcer of unspecified site, stage 3: Secondary | ICD-10-CM

## 2012-11-16 DIAGNOSIS — F0280 Dementia in other diseases classified elsewhere without behavioral disturbance: Secondary | ICD-10-CM | POA: Insufficient documentation

## 2012-11-16 DIAGNOSIS — I739 Peripheral vascular disease, unspecified: Secondary | ICD-10-CM

## 2012-11-16 DIAGNOSIS — G894 Chronic pain syndrome: Secondary | ICD-10-CM

## 2012-11-16 DIAGNOSIS — I10 Essential (primary) hypertension: Secondary | ICD-10-CM

## 2012-11-16 DIAGNOSIS — L89523 Pressure ulcer of left ankle, stage 3: Secondary | ICD-10-CM

## 2012-11-16 DIAGNOSIS — E785 Hyperlipidemia, unspecified: Secondary | ICD-10-CM | POA: Insufficient documentation

## 2012-11-16 DIAGNOSIS — G309 Alzheimer's disease, unspecified: Secondary | ICD-10-CM

## 2012-11-16 DIAGNOSIS — L89509 Pressure ulcer of unspecified ankle, unspecified stage: Secondary | ICD-10-CM | POA: Insufficient documentation

## 2012-11-16 MED ORDER — ALPRAZOLAM 0.25 MG PO TABS: ORAL_TABLET | ORAL | Status: DC

## 2012-11-16 MED ORDER — LOSARTAN POTASSIUM 100 MG PO TABS: 100.0000 mg | ORAL_TABLET | Freq: Every day | ORAL | Status: DC

## 2012-11-16 MED ORDER — OXYCODONE HCL 5 MG PO TABS: ORAL_TABLET | ORAL | Status: DC

## 2012-11-16 NOTE — Progress Notes (Signed)
Patient ID: [name], female   DOB: May 12, 1919, 79 y.o.   MRN: [id number] Code Status: DNR   Allergies  Allergen Reactions  . Celebrex (Celecoxib)   . Epinephrine   . Lidocaine   . Lyrica (Pregabalin)   . Morphine And Related   . Oxycodone   . Sulfa Antibiotics   . Ultram (Tramadol Hcl)     Chief Complaint  Patient presents with  . Annual Exam  . Joint Swelling  . Nasal Congestion    HPI: Patient is a 79 y.o. Caucasian female seen in the office today for routine mgt of chronic conditions.  Some increased swelling of ankles (none now).  Is coughing also.  Congested in past two weeks.  Is on generic mucinex.    Previously, had some difficulty with mild ankle edema.  Have actually improved per her caretaker.    Memory is progressively worse.  Has some loss of long term memory, as well.    Pains unchanged.  Is much slower than she used to be.  Lady where she stays also has a sister staying there and they are good friends.  Hurting in right knee at present.    Review of Systems:  Review of Systems  Constitutional: Positive for malaise/fatigue. Negative for fever and chills.  HENT: Positive for congestion. Negative for ear pain and sore throat.   Eyes: Negative for blurred vision.  Respiratory: Positive for cough. Negative for shortness of breath.   Cardiovascular: Negative for chest pain.  Gastrointestinal: Negative for heartburn and constipation.  Genitourinary: Negative for dysuria.  Musculoskeletal: Positive for back pain and joint pain. Negative for falls.  Skin: Negative for rash.  Neurological: Positive for weakness. Negative for dizziness.  Endo/Heme/Allergies: Does not bruise/bleed easily.  Psychiatric/Behavioral: Positive for depression and memory loss.     Past Medical History  Diagnosis Date  . Hyperlipidemia   . Hypertension   . Arthritis   . Anxiety   . Asthma   . Leg pain     with walking  . Peripheral arterial disease   . Dementia    Past  Surgical History  Procedure Laterality Date  . Back surgery     Social History:   reports that she quit smoking about 55 years ago. Her smoking use included Cigarettes. She smoked 0.00 packs per day. She does not have any smokeless tobacco history on file. She reports that she does not drink alcohol or use illicit drugs.  Family History  Problem Relation Age of Onset  . Heart disease Mother   . Heart disease Brother     Medications: Patient's Medications  New Prescriptions   No medications on file  Previous Medications   AMLODIPINE (NORVASC) 10 MG TABLET    Take 5 mg by mouth daily.    CLONIDINE (CATAPRES) 0.3 MG TABLET    Take 0.3 mg by mouth 2 (two) times daily.     DICLOFENAC SODIUM (PENNSAID) 1.5 % SOLN    Place onto the skin.     FLUTICASONE (VERAMYST) 27.5 MCG/SPRAY NASAL SPRAY    Place 2 sprays into the nose 2 (two) times daily as needed.     HYDRALAZINE (APRESOLINE) 25 MG TABLET    Take 50 mg by mouth 4 (four) times daily.    LEVOTHYROXINE (SYNTHROID, LEVOTHROID) 75 MCG TABLET    Take 75 mcg by mouth daily.     MAGNESIUM CITRATE PO    Take by mouth as needed.    Modified Medications  Modified Medication Previous Medication   ALPRAZOLAM (XANAX) 0.25 MG TABLET ALPRAZolam (XANAX) 0.25 MG tablet      Take 1/2 -1 tablet two times a day as needed for anxiety    Take 1/2 -1 tablet two times a day as needed for anxiety   LOSARTAN (COZAAR) 100 MG TABLET losartan (COZAAR) 100 MG tablet      Take 1 tablet (100 mg total) by mouth daily. Take one tablet once daily for blood pressure    Take one tablet once daily for blood pressure   OXYCODONE (OXY IR/ROXICODONE) 5 MG IMMEDIATE RELEASE TABLET oxyCODONE (OXY IR/ROXICODONE) 5 MG immediate release tablet      Take one tablet every 6 hours as needed for pain    Take one tablet every 6 hours as needed for pain  Discontinued Medications   ALUM & MAG HYDROXIDE-SIMETH (MAGIC MOUTHWASH) SOLN    Take by mouth.     ASPIRIN EC 81 MG TABLET    Take  81 mg by mouth daily.     CELECOXIB (CELEBREX) 200 MG CAPSULE    Take 200 mg by mouth daily.     DONEPEZIL (ARICEPT) 10 MG TABLET    Take 10 mg by mouth at bedtime as needed.    GABAPENTIN (NEURONTIN) 100 MG CAPSULE    Take 100 mg by mouth 3 (three) times daily.     OXYCODONE-ACETAMINOPHEN (PERCOCET) 10-325 MG PER TABLET    Take 1 tablet by mouth every 8 (eight) hours as needed.     TROSPIUM (SANCTURA) 20 MG TABLET    Take 20 mg by mouth 2 (two) times daily.     VALSARTAN (DIOVAN) 320 MG TABLET    Take 320 mg by mouth daily.       Physical Exam: Filed Vitals:   11/16/12 1606  BP: 134/82  Pulse: 63  Temp: 97.9 F (36.6 C)  TempSrc: Oral  Resp: 14  Height: 5\' 3"  (1.6 m)  Weight: 94 lb 12.8 oz (43.001 kg)  SpO2: 96%   Physical Exam  Constitutional:  Frail Caucasian female in NAD  HENT:  Head: Normocephalic and atraumatic.  Right Ear: External ear normal.  Left Ear: External ear normal.  Nose: Nose normal.  Mouth/Throat: Oropharynx is clear and moist.  Eyes: Pupils are equal, round, and reactive to light.  Neck: Normal range of motion. Neck supple.  Cardiovascular: Normal rate, regular rhythm, normal heart sounds and intact distal pulses.   Pulmonary/Chest: Effort normal. She exhibits no tenderness. Right breast exhibits no mass and no nipple discharge. Left breast exhibits no mass and no nipple discharge.  Few audible rhonchi  Abdominal: Soft. Bowel sounds are normal.  Musculoskeletal:  Slowly ambulates with rollator walker  Neurological: She is alert.  Oriented to person and place, no time  Skin: Skin is warm and dry.  Psychiatric: She has a normal mood and affect.   Labs reviewed: 09/03/2010 CBC Wbc 5.8 Rbc 4.46 Hemoglobin 13.4  CMP Glucose 122 Bun 23 creatinine 1.16  TSH 4.380  Vitamin D 25 Hydroxy 16.5  Vitamin B12 160 Folate 10.7  01/04/2011 Vitamin D 11.7 02/16/2011 CBC normal  CMP: glucose 116, UN 21, Creatinine 1.21 Lipid: cholesterol 226, triglycerides 88,  HDL 71, LDL 137 Vitamin D  30.4 05/17/11 Vit B12: 1671 Vit D 56.6  08/02/2011 BMP Glucose 119 Bun 18 Creatinine 1.18  CBC Wbc 6.7 Rbc 4.61 Hemoglobin 12.9  Protein Electro Protein 7.4 Albumin 4.4  TSH 3.780  12/02/2011 CBC; WBC 5.6, RBC  4.21, HGB 12.3 CMP; Glucose 158, BUN 28, Creatinine 1.69 12/06/2011 UA and UC 12/24/2011  u/a 05/30/12 CBC; WBC 7.6, RBC 4.41, HGB 13.2 CMP; Glucose 126, BUN 14, Creatinine 1.20 07/03/12 CBC; WBC 7.6, RBC 4.41, Hemoglobin 13.2 CMP; Glucose 126, BUN 14, Creatinine 1.20 Past Procedures: MINI-MENTAL STATUS EXAM:   09/03/2010 24/30 Failed clock drawing test  06/25/2009-Right L5, S1 selective nerve root block 10/2009-Lumbar X-Ray: No evidence of a new Compression Fracture 12/01/2009-Nerve Conduction Study: Normal Study. There is no electrophysiologic evidence of peripheral neuropathy. This study does not exclude small fiber neuropathies. 2011- Abdomen X-Ray 2011-MRI Right Knee and Hip  Assessment/Plan Alzheimer's dementia Son has noted some worsening of her memory recently and more difficulty with long term memory now.  She has no new complications of her dementia.  She had previously been on medications for her dementia, but was on hospice care so these were stopped.  Chronic pain syndrome Continues 5mg  oxycodone q 6 hrs as needed for pain.  This seems to adequately control her chronic pain w/o making her too drowsy or confused.  Pressure ulcer of ankle Had one over malleolus which has healed.  Continues to have a second ulcer which is smaller than a pencil eraser at present--slowly healing.  Is painful, but w/o signs of infection.  Hypertension bp is satisfactory with clonidine, amlodipine, losartan and hydralazine.  No changes.  Hyperlipidemia Has not been on medication for this due to her goals of care being purely comfort and not preventive at this stage.    PAD (peripheral artery disease) Not helping her wound to heal at this time.  Location  actually suggests circulatory etiology.  f/u 6 mos.

## 2012-11-16 NOTE — Patient Instructions (Signed)
Let me know if she develops fever, chills, or coughing is keeping her awake.  Otherwise, continue her generic mucinex and keep hydrated.

## 2012-11-18 NOTE — Assessment & Plan Note (Signed)
Continues 5mg  oxycodone q 6 hrs as needed for pain.  This seems to adequately control her chronic pain w/o making her too drowsy or confused.

## 2012-11-18 NOTE — Assessment & Plan Note (Signed)
bp is satisfactory with clonidine, amlodipine, losartan and hydralazine.  No changes.

## 2012-11-18 NOTE — Assessment & Plan Note (Signed)
Son has noted some worsening of her memory recently and more difficulty with long term memory now.  She has no new complications of her dementia.  She had previously been on medications for her dementia, but was on hospice care so these were stopped.

## 2012-11-18 NOTE — Assessment & Plan Note (Signed)
Not helping her wound to heal at this time.  Location actually suggests circulatory etiology.

## 2012-11-18 NOTE — Assessment & Plan Note (Signed)
Had one over malleolus which has healed.  Continues to have a second ulcer which is smaller than a pencil eraser at present--slowly healing.  Is painful, but w/o signs of infection.

## 2012-11-18 NOTE — Assessment & Plan Note (Signed)
Has not been on medication for this due to her goals of care being purely comfort and not preventive at this stage.

## 2013-01-02 ENCOUNTER — Other Ambulatory Visit: Payer: Self-pay | Admitting: *Deleted

## 2013-01-02 DIAGNOSIS — G894 Chronic pain syndrome: Secondary | ICD-10-CM

## 2013-01-02 MED ORDER — OXYCODONE HCL 5 MG PO TABS: ORAL_TABLET | ORAL | Status: DC

## 2013-01-02 MED ORDER — ALPRAZOLAM 0.25 MG PO TABS: ORAL_TABLET | ORAL | Status: DC

## 2013-02-07 ENCOUNTER — Other Ambulatory Visit: Payer: Self-pay | Admitting: Geriatric Medicine

## 2013-02-07 DIAGNOSIS — G894 Chronic pain syndrome: Secondary | ICD-10-CM

## 2013-02-07 MED ORDER — OXYCODONE HCL 5 MG PO TABS: ORAL_TABLET | ORAL | Status: DC

## 2013-02-07 MED ORDER — ALPRAZOLAM 0.25 MG PO TABS: ORAL_TABLET | ORAL | Status: DC

## 2013-02-13 ENCOUNTER — Other Ambulatory Visit: Payer: Self-pay | Admitting: Internal Medicine

## 2013-03-13 ENCOUNTER — Other Ambulatory Visit: Payer: Self-pay | Admitting: *Deleted

## 2013-03-13 DIAGNOSIS — G894 Chronic pain syndrome: Secondary | ICD-10-CM

## 2013-03-13 MED ORDER — ALPRAZOLAM 0.25 MG PO TABS: ORAL_TABLET | ORAL | Status: DC

## 2013-03-13 MED ORDER — OXYCODONE HCL 5 MG PO TABS: ORAL_TABLET | ORAL | Status: DC

## 2013-03-27 ENCOUNTER — Non-Acute Institutional Stay (SKILLED_NURSING_FACILITY): Payer: Medicare Other | Admitting: Nurse Practitioner

## 2013-03-27 DIAGNOSIS — G894 Chronic pain syndrome: Secondary | ICD-10-CM

## 2013-03-27 DIAGNOSIS — F0280 Dementia in other diseases classified elsewhere without behavioral disturbance: Secondary | ICD-10-CM

## 2013-03-27 DIAGNOSIS — I739 Peripheral vascular disease, unspecified: Secondary | ICD-10-CM

## 2013-04-16 ENCOUNTER — Other Ambulatory Visit: Payer: Self-pay | Admitting: *Deleted

## 2013-04-16 DIAGNOSIS — G894 Chronic pain syndrome: Secondary | ICD-10-CM

## 2013-04-16 MED ORDER — OXYCODONE HCL 5 MG PO TABS: ORAL_TABLET | ORAL | Status: DC

## 2013-04-16 MED ORDER — ALPRAZOLAM 0.25 MG PO TABS: ORAL_TABLET | ORAL | Status: DC

## 2013-05-07 ENCOUNTER — Other Ambulatory Visit: Payer: Self-pay | Admitting: Internal Medicine

## 2013-05-07 MED ORDER — CLONIDINE HCL 0.3 MG PO TABS: ORAL_TABLET | ORAL | Status: DC

## 2013-05-07 MED ORDER — AMLODIPINE BESYLATE 5 MG PO TABS: ORAL_TABLET | ORAL | Status: DC

## 2013-05-16 ENCOUNTER — Other Ambulatory Visit: Payer: Self-pay | Admitting: *Deleted

## 2013-05-16 DIAGNOSIS — G894 Chronic pain syndrome: Secondary | ICD-10-CM

## 2013-05-16 MED ORDER — OXYCODONE HCL 5 MG PO TABS: ORAL_TABLET | ORAL | Status: DC

## 2013-05-24 ENCOUNTER — Ambulatory Visit: Payer: Self-pay | Admitting: Internal Medicine

## 2013-05-28 ENCOUNTER — Ambulatory Visit (INDEPENDENT_AMBULATORY_CARE_PROVIDER_SITE_OTHER): Payer: Medicare Other | Admitting: Internal Medicine

## 2013-05-28 ENCOUNTER — Encounter: Payer: Self-pay | Admitting: Internal Medicine

## 2013-05-28 VITALS — BP 164/86 | HR 64 | Temp 97.2°F | Wt 90.2 lb

## 2013-05-28 DIAGNOSIS — G894 Chronic pain syndrome: Secondary | ICD-10-CM

## 2013-05-28 DIAGNOSIS — I10 Essential (primary) hypertension: Secondary | ICD-10-CM

## 2013-05-28 DIAGNOSIS — E785 Hyperlipidemia, unspecified: Secondary | ICD-10-CM

## 2013-05-28 DIAGNOSIS — F0280 Dementia in other diseases classified elsewhere without behavioral disturbance: Secondary | ICD-10-CM

## 2013-05-28 MED ORDER — LOSARTAN POTASSIUM 100 MG PO TABS: 100.0000 mg | ORAL_TABLET | Freq: Every day | ORAL | Status: DC

## 2013-05-28 MED ORDER — HYDRALAZINE HCL 50 MG PO TABS: 50.0000 mg | ORAL_TABLET | Freq: Four times a day (QID) | ORAL | Status: DC

## 2013-05-28 MED ORDER — LEVOTHYROXINE SODIUM 75 MCG PO TABS: 75.0000 ug | ORAL_TABLET | ORAL | Status: AC

## 2013-05-28 MED ORDER — AMLODIPINE BESYLATE 5 MG PO TABS: ORAL_TABLET | ORAL | Status: DC

## 2013-05-28 MED ORDER — CLONIDINE HCL 0.3 MG PO TABS: ORAL_TABLET | ORAL | Status: DC

## 2013-05-28 NOTE — Progress Notes (Signed)
Patient ID: [name], female   DOB: 1918-11-30, 79 y.o.   MRN: [id number] Location:  [facility]  Code Status: DNR   Allergies  Allergen Reactions  . Celebrex [Celecoxib]   . Epinephrine   . Lidocaine   . Lyrica [Pregabalin]   . Morphine And Related   . Oxycodone   . Sulfa Antibiotics   . Ultram [Tramadol Hcl]     Chief Complaint  Patient presents with  . Medical Managment of Chronic Issues    6 month f/u  . Back Pain    on-going still bothering her  . other    memory issues/getting words out clear getitng tougher    HPI: Patient is a 79 y.o. white female seen in the office today for medical management of chronic issues--she is being managed palliatively. Patient has had ongoing back pain that started after she had back surgery two years ago. Has had ongoing back pain since then, was seeing a pain specialist at her surgeons office but no longer seeing them. Was using Pennsaid and that offered some relief but it is expensive. Son states that the surgeon has looked at her back and knees and decided she was no longer a candidate for any type of surgery. Son states that she takes ibuprofen TID and that seems to help her. Continues to oxycodone 3-4 times a day.  BP is stable- saw vascular MD for Renal artery stenosis, no intervention at this time.   Walking around at home a fair amt--visits with her neighbor frequently.    C/o of knee pain also, but this is controlled when she gets her medications.  C/o skin spots on her face.    Review of Systems:  Review of Systems  Constitutional: Positive for malaise/fatigue. Negative for fever, chills and weight loss.  HENT: Positive for congestion.        Has had some flem according to son that yesterday she had trouble getting out  Respiratory: Negative for shortness of breath.        Has had some flem according to son that yesterday she had trouble getting out  Cardiovascular: Negative for  chest pain.  Gastrointestinal: Negative for nausea, vomiting, diarrhea and constipation.       Was having issues with constipation d/t pain medication  Genitourinary: Negative for dysuria, urgency and frequency.  Musculoskeletal: Positive for back pain and joint pain. Negative for falls.       Back and knee pain, pain in right buttock  Skin: Negative for rash.  Neurological: Positive for weakness.  Psychiatric/Behavioral: Positive for memory loss. Negative for depression.     Past Medical History  Diagnosis Date  . Hyperlipidemia   . Hypertension   . Arthritis   . Anxiety   . Asthma   . Leg pain     with walking  . Peripheral arterial disease   . Dementia     Past Surgical History  Procedure Laterality Date  . Back surgery      Social History:   reports that she quit smoking about 55 years ago. Her smoking use included Cigarettes. She smoked 0.00 packs per day. She does not have any smokeless tobacco history on file. She reports that she does not drink alcohol or use illicit drugs.  Family History  Problem Relation Age of Onset  . Heart disease Mother   . Heart disease Brother     Medications: Patient's Medications  New Prescriptions  No medications on file  Previous Medications   ALPRAZOLAM (XANAX) 0.25 MG TABLET    TAKE 1/2-1 TABLET BY MOUTH TWICE A DAY AS NEEDED FOR ANXIETY   AMLODIPINE (NORVASC) 5 MG TABLET    Take one tablet once daily for blood pressure   CLONIDINE (CATAPRES) 0.3 MG TABLET    Take one tablet twice daily to control blood pressure   DICLOFENAC SODIUM (PENNSAID) 1.5 % SOLN    Place onto the skin.     FLUTICASONE (VERAMYST) 27.5 MCG/SPRAY NASAL SPRAY    Place 2 sprays into the nose 2 (two) times daily as needed.     HYDRALAZINE (APRESOLINE) 50 MG TABLET    TAKE 1 TABLET 4 TIMES DAILY FOR BLOOD PRESSURE   LEVOTHYROXINE (SYNTHROID, LEVOTHROID) 75 MCG TABLET    TAKE 1 TABLET EVERY DAY   LOSARTAN (COZAAR) 100 MG TABLET    Take 1 tablet (100 mg total)  by mouth daily. Take one tablet once daily for blood pressure   MAGNESIUM CITRATE PO    Take by mouth as needed.     OXYCODONE (OXY IR/ROXICODONE) 5 MG IMMEDIATE RELEASE TABLET    Take one tablet every 6 hours as needed for pain  Modified Medications   No medications on file  Discontinued Medications   AMLODIPINE (NORVASC) 10 MG TABLET    Take 5 mg by mouth daily.    HYDRALAZINE (APRESOLINE) 25 MG TABLET    Take 50 mg by mouth 4 (four) times daily.      Physical Exam: Filed Vitals:   05/28/13 1633  BP: 164/86  Pulse: 64  Temp: 97.2 F (36.2 C)  TempSrc: Oral  Weight: 90 lb 3.2 oz (40.914 kg)  SpO2: 96%   Physical Exam  Constitutional: She is oriented to person, place, and time.  Thin frail white female, walks with rollator walker with stooped posture, kyphosis and lumbar lordosis  Eyes: Conjunctivae are normal.  Cardiovascular: Normal rate, regular rhythm, normal heart sounds and intact distal pulses.   Pulmonary/Chest: Effort normal and breath sounds normal.  Abdominal: Soft. Bowel sounds are normal.  Abdomen protrudes, has right lower quadrant hernia  Musculoskeletal: She exhibits tenderness. She exhibits no edema.  In lower back  Neurological: She is alert and oriented to person, place, and time.  Skin: Skin is warm and dry.  Psychiatric: She has a normal mood and affect.   Assessment/Plan 1. Hypertension -fairly well controlled most of the time and gets dizzy, lightheaded with higher doses of bp meds - losartan (COZAAR) 100 MG tablet; Take 1 tablet (100 mg total) by mouth daily. Take one tablet once daily for blood pressure  Dispense: 90 tablet; Refill: 3  2. Chronic pain syndrome -pain is controlled when receives her narcotic pain meds and higher doses cause sedation and confusion so these have been kept the same  3. Alzheimer's dementia -end stages--has progression of her aphasia, more time confused than not, sometimes not even oriented to person (said her son was  her neighbor today one time and knew who he was another time), is inattentive -was previously on hospice, but did not decline enough -has now lost about 16 lbs in a year, but does eat pretty well  4. Hyperlipidemia -no longer on medication and risks outweigh benefits at this point in her disease process  Labs/tests ordered:  none Next appt:  6 mos

## 2013-07-03 ENCOUNTER — Other Ambulatory Visit: Payer: Self-pay | Admitting: *Deleted

## 2013-07-03 DIAGNOSIS — G894 Chronic pain syndrome: Secondary | ICD-10-CM

## 2013-07-03 MED ORDER — OXYCODONE HCL 5 MG PO TABS: ORAL_TABLET | ORAL | Status: DC

## 2013-07-03 MED ORDER — ALPRAZOLAM 0.25 MG PO TABS: ORAL_TABLET | ORAL | Status: DC

## 2013-07-09 ENCOUNTER — Inpatient Hospital Stay (HOSPITAL_COMMUNITY): Payer: Medicare Other

## 2013-07-09 ENCOUNTER — Emergency Department (HOSPITAL_COMMUNITY): Payer: Medicare Other

## 2013-07-09 ENCOUNTER — Inpatient Hospital Stay (HOSPITAL_COMMUNITY)
Admission: EM | Admit: 2013-07-09 | Discharge: 2013-07-14 | DRG: 480 | Disposition: A | Discharge status: Home Health | Payer: Medicare Other | Attending: Internal Medicine | Admitting: Internal Medicine

## 2013-07-09 ENCOUNTER — Inpatient Hospital Stay (HOSPITAL_COMMUNITY): Payer: Medicare Other | Admitting: Anesthesiology

## 2013-07-09 ENCOUNTER — Encounter (HOSPITAL_COMMUNITY): Payer: Medicare Other | Admitting: Anesthesiology

## 2013-07-09 ENCOUNTER — Encounter (HOSPITAL_COMMUNITY)
Admission: EM | Disposition: A | Discharge status: Home Health | Payer: Self-pay | Source: Home / Self Care | Attending: Internal Medicine

## 2013-07-09 ENCOUNTER — Encounter (HOSPITAL_COMMUNITY): Payer: Self-pay | Admitting: Emergency Medicine

## 2013-07-09 DIAGNOSIS — Z87891 Personal history of nicotine dependence: Secondary | ICD-10-CM

## 2013-07-09 DIAGNOSIS — I739 Peripheral vascular disease, unspecified: Secondary | ICD-10-CM | POA: Diagnosis present

## 2013-07-09 DIAGNOSIS — F419 Anxiety disorder, unspecified: Secondary | ICD-10-CM | POA: Diagnosis not present

## 2013-07-09 DIAGNOSIS — J45909 Unspecified asthma, uncomplicated: Secondary | ICD-10-CM | POA: Diagnosis not present

## 2013-07-09 DIAGNOSIS — E785 Hyperlipidemia, unspecified: Secondary | ICD-10-CM | POA: Diagnosis present

## 2013-07-09 DIAGNOSIS — G309 Alzheimer's disease, unspecified: Secondary | ICD-10-CM | POA: Diagnosis present

## 2013-07-09 DIAGNOSIS — J189 Pneumonia, unspecified organism: Secondary | ICD-10-CM

## 2013-07-09 DIAGNOSIS — Z7982 Long term (current) use of aspirin: Secondary | ICD-10-CM

## 2013-07-09 DIAGNOSIS — E43 Unspecified severe protein-calorie malnutrition: Secondary | ICD-10-CM | POA: Diagnosis present

## 2013-07-09 DIAGNOSIS — Z79899 Other long term (current) drug therapy: Secondary | ICD-10-CM

## 2013-07-09 DIAGNOSIS — R531 Weakness: Secondary | ICD-10-CM | POA: Diagnosis present

## 2013-07-09 DIAGNOSIS — Z8249 Family history of ischemic heart disease and other diseases of the circulatory system: Secondary | ICD-10-CM

## 2013-07-09 DIAGNOSIS — Z882 Allergy status to sulfonamides status: Secondary | ICD-10-CM

## 2013-07-09 DIAGNOSIS — Z888 Allergy status to other drugs, medicaments and biological substances status: Secondary | ICD-10-CM

## 2013-07-09 DIAGNOSIS — N179 Acute kidney failure, unspecified: Secondary | ICD-10-CM | POA: Diagnosis present

## 2013-07-09 DIAGNOSIS — S72143A Displaced intertrochanteric fracture of unspecified femur, initial encounter for closed fracture: Principal | ICD-10-CM | POA: Diagnosis present

## 2013-07-09 DIAGNOSIS — F0280 Dementia in other diseases classified elsewhere without behavioral disturbance: Secondary | ICD-10-CM | POA: Diagnosis present

## 2013-07-09 DIAGNOSIS — Z681 Body mass index (BMI) 19 or less, adult: Secondary | ICD-10-CM

## 2013-07-09 DIAGNOSIS — I10 Essential (primary) hypertension: Secondary | ICD-10-CM | POA: Diagnosis present

## 2013-07-09 DIAGNOSIS — S72001A Fracture of unspecified part of neck of right femur, initial encounter for closed fracture: Secondary | ICD-10-CM

## 2013-07-09 DIAGNOSIS — D62 Acute posthemorrhagic anemia: Secondary | ICD-10-CM | POA: Diagnosis not present

## 2013-07-09 DIAGNOSIS — Z66 Do not resuscitate: Secondary | ICD-10-CM | POA: Diagnosis present

## 2013-07-09 DIAGNOSIS — S72009A Fracture of unspecified part of neck of unspecified femur, initial encounter for closed fracture: Secondary | ICD-10-CM

## 2013-07-09 DIAGNOSIS — Z515 Encounter for palliative care: Secondary | ICD-10-CM

## 2013-07-09 DIAGNOSIS — F411 Generalized anxiety disorder: Secondary | ICD-10-CM | POA: Diagnosis present

## 2013-07-09 DIAGNOSIS — Y9210 Unspecified residential institution as the place of occurrence of the external cause: Secondary | ICD-10-CM | POA: Diagnosis present

## 2013-07-09 DIAGNOSIS — D649 Anemia, unspecified: Secondary | ICD-10-CM

## 2013-07-09 DIAGNOSIS — W19XXXA Unspecified fall, initial encounter: Secondary | ICD-10-CM | POA: Diagnosis present

## 2013-07-09 DIAGNOSIS — G894 Chronic pain syndrome: Secondary | ICD-10-CM | POA: Diagnosis present

## 2013-07-09 DIAGNOSIS — E039 Hypothyroidism, unspecified: Secondary | ICD-10-CM | POA: Diagnosis present

## 2013-07-09 DIAGNOSIS — R5381 Other malaise: Secondary | ICD-10-CM | POA: Diagnosis present

## 2013-07-09 HISTORY — PX: FEMUR IM NAIL: SHX1597

## 2013-07-09 LAB — BASIC METABOLIC PANEL
CO2: 28 mEq/L (ref 19–32)
Calcium: 8.7 mg/dL (ref 8.4–10.5)
Chloride: 99 mEq/L (ref 96–112)
Potassium: 3.2 mEq/L — ABNORMAL LOW (ref 3.5–5.1)
Sodium: 138 mEq/L (ref 135–145)

## 2013-07-09 LAB — CBC WITH DIFFERENTIAL/PLATELET
Basophils Absolute: 0 10*3/uL (ref 0.0–0.1)
Eosinophils Relative: 0 % (ref 0–5)
Lymphocytes Relative: 10 % — ABNORMAL LOW (ref 12–46)
Lymphs Abs: 0.8 10*3/uL (ref 0.7–4.0)
MCV: 90 fL (ref 78.0–100.0)
Neutro Abs: 6.1 10*3/uL (ref 1.7–7.7)
Neutrophils Relative %: 81 % — ABNORMAL HIGH (ref 43–77)
Platelets: 216 10*3/uL (ref 150–400)
RBC: 3.61 MIL/uL — ABNORMAL LOW (ref 3.87–5.11)
RDW: 14.2 % (ref 11.5–15.5)
WBC: 7.6 10*3/uL (ref 4.0–10.5)

## 2013-07-09 LAB — SURGICAL PCR SCREEN
MRSA, PCR: POSITIVE — AB
Staphylococcus aureus: POSITIVE — AB

## 2013-07-09 SURGERY — INSERTION, INTRAMEDULLARY ROD, FEMUR
Anesthesia: General | Laterality: Right

## 2013-07-09 MED ORDER — FENTANYL CITRATE 0.05 MG/ML IJ SOLN
INTRAMUSCULAR | Status: DC | PRN
  Administered 2013-07-09 (×6): 25 ug via INTRAVENOUS

## 2013-07-09 MED ORDER — ROCURONIUM BROMIDE 100 MG/10ML IV SOLN
INTRAVENOUS | Status: DC | PRN
  Administered 2013-07-09: 5 mg via INTRAVENOUS
  Administered 2013-07-09: 20 mg via INTRAVENOUS

## 2013-07-09 MED ORDER — FENTANYL CITRATE 0.05 MG/ML IJ SOLN
50.0000 ug | INTRAMUSCULAR | Status: DC | PRN
  Administered 2013-07-09 (×2): 50 ug via INTRAVENOUS
  Filled 2013-07-09: qty 2

## 2013-07-09 MED ORDER — EPHEDRINE SULFATE 50 MG/ML IJ SOLN
INTRAMUSCULAR | Status: DC | PRN
  Administered 2013-07-09: 10 mg via INTRAVENOUS

## 2013-07-09 MED ORDER — PHENYLEPHRINE HCL 10 MG/ML IJ SOLN
10.0000 mg | INTRAVENOUS | Status: DC | PRN
  Administered 2013-07-09: 20 ug/min via INTRAVENOUS

## 2013-07-09 MED ORDER — NEOSTIGMINE METHYLSULFATE 1 MG/ML IJ SOLN
INTRAMUSCULAR | Status: DC | PRN
  Administered 2013-07-09: 2 mg via INTRAVENOUS

## 2013-07-09 MED ORDER — FENTANYL CITRATE 0.05 MG/ML IJ SOLN
50.0000 ug | Freq: Once | INTRAMUSCULAR | Status: AC
  Administered 2013-07-09: 50 ug via INTRAVENOUS
  Filled 2013-07-09: qty 2

## 2013-07-09 MED ORDER — ACETAMINOPHEN 650 MG RE SUPP: 650.0000 mg | Freq: Four times a day (QID) | RECTAL | Status: DC | PRN

## 2013-07-09 MED ORDER — AMLODIPINE BESYLATE 5 MG PO TABS
5.0000 mg | ORAL_TABLET | Freq: Every day | ORAL | Status: DC
  Administered 2013-07-10 – 2013-07-11 (×2): 5 mg via ORAL
  Filled 2013-07-09 (×4): qty 1

## 2013-07-09 MED ORDER — CEFAZOLIN SODIUM 1-5 GM-% IV SOLN
1.0000 g | Freq: Four times a day (QID) | INTRAVENOUS | Status: AC
  Administered 2013-07-10 (×3): 1 g via INTRAVENOUS
  Filled 2013-07-09 (×4): qty 50

## 2013-07-09 MED ORDER — POTASSIUM CHLORIDE 10 MEQ/100ML IV SOLN
10.0000 meq | INTRAVENOUS | Status: AC
  Administered 2013-07-09 (×2): 10 meq via INTRAVENOUS
  Filled 2013-07-09 (×3): qty 100

## 2013-07-09 MED ORDER — CEFAZOLIN SODIUM 1-5 GM-% IV SOLN
INTRAVENOUS | Status: DC | PRN
  Administered 2013-07-09: 1 g via INTRAVENOUS

## 2013-07-09 MED ORDER — LEVOTHYROXINE SODIUM 75 MCG PO TABS
75.0000 ug | ORAL_TABLET | Freq: Every day | ORAL | Status: DC
  Administered 2013-07-09 – 2013-07-14 (×5): 75 ug via ORAL
  Filled 2013-07-09 (×7): qty 1

## 2013-07-09 MED ORDER — ONDANSETRON HCL 4 MG PO TABS: 4.0000 mg | ORAL_TABLET | Freq: Four times a day (QID) | ORAL | Status: DC | PRN

## 2013-07-09 MED ORDER — ALPRAZOLAM 0.25 MG PO TABS
0.2500 mg | ORAL_TABLET | Freq: Three times a day (TID) | ORAL | Status: DC | PRN
  Administered 2013-07-10 – 2013-07-12 (×2): 0.25 mg via ORAL
  Filled 2013-07-09: qty 2
  Filled 2013-07-09: qty 1

## 2013-07-09 MED ORDER — CLONIDINE HCL 0.3 MG PO TABS
0.3000 mg | ORAL_TABLET | Freq: Two times a day (BID) | ORAL | Status: DC
  Administered 2013-07-10 – 2013-07-14 (×9): 0.3 mg via ORAL
  Filled 2013-07-09 (×12): qty 1

## 2013-07-09 MED ORDER — HYDRALAZINE HCL 20 MG/ML IJ SOLN
INTRAMUSCULAR | Status: AC
  Administered 2013-07-09: 10 mg via INTRAVENOUS
  Filled 2013-07-09: qty 1

## 2013-07-09 MED ORDER — GLYCOPYRROLATE 0.2 MG/ML IJ SOLN
INTRAMUSCULAR | Status: DC | PRN
  Administered 2013-07-09: 0.3 mg via INTRAVENOUS

## 2013-07-09 MED ORDER — OXYCODONE-ACETAMINOPHEN 5-325 MG PO TABS: 1.0000 | ORAL_TABLET | ORAL | Status: DC | PRN

## 2013-07-09 MED ORDER — MENTHOL 3 MG MT LOZG: 1.0000 | LOZENGE | OROMUCOSAL | Status: DC | PRN

## 2013-07-09 MED ORDER — POLYETHYLENE GLYCOL 3350 17 G PO PACK
17.0000 g | PACK | Freq: Every day | ORAL | Status: DC | PRN
  Administered 2013-07-10: 17 g via ORAL
  Filled 2013-07-09 (×2): qty 1

## 2013-07-09 MED ORDER — MORPHINE SULFATE 2 MG/ML IJ SOLN: 0.5000 mg | INTRAMUSCULAR | Status: DC | PRN

## 2013-07-09 MED ORDER — OXYCODONE HCL 5 MG PO TABS
5.0000 mg | ORAL_TABLET | ORAL | Status: DC | PRN
  Administered 2013-07-09: 5 mg via ORAL
  Administered 2013-07-10 – 2013-07-11 (×6): 10 mg via ORAL
  Filled 2013-07-09 (×2): qty 2
  Filled 2013-07-09: qty 1
  Filled 2013-07-09 (×5): qty 2

## 2013-07-09 MED ORDER — FENTANYL CITRATE 0.05 MG/ML IJ SOLN
25.0000 ug | INTRAMUSCULAR | Status: DC | PRN
  Administered 2013-07-09: 50 ug via INTRAVENOUS
  Administered 2013-07-09 (×2): 25 ug via INTRAVENOUS

## 2013-07-09 MED ORDER — CEFAZOLIN SODIUM 1-5 GM-% IV SOLN
1.0000 g | INTRAVENOUS | Status: DC
  Filled 2013-07-09 (×2): qty 50

## 2013-07-09 MED ORDER — ASPIRIN EC 325 MG PO TBEC: 325.0000 mg | DELAYED_RELEASE_TABLET | Freq: Every day | ORAL | Status: AC

## 2013-07-09 MED ORDER — HYDROCODONE-ACETAMINOPHEN 5-325 MG PO TABS: 1.0000 | ORAL_TABLET | Freq: Four times a day (QID) | ORAL | Status: DC | PRN

## 2013-07-09 MED ORDER — ACETAMINOPHEN 325 MG PO TABS
650.0000 mg | ORAL_TABLET | Freq: Four times a day (QID) | ORAL | Status: DC | PRN
  Administered 2013-07-13 (×2): 650 mg via ORAL
  Filled 2013-07-09 (×2): qty 2

## 2013-07-09 MED ORDER — HYDROMORPHONE HCL PF 1 MG/ML IJ SOLN: 0.5000 mg | INTRAMUSCULAR | Status: DC | PRN

## 2013-07-09 MED ORDER — FENTANYL CITRATE 0.05 MG/ML IJ SOLN
INTRAMUSCULAR | Status: AC
  Administered 2013-07-09: 25 ug via INTRAVENOUS
  Filled 2013-07-09: qty 2

## 2013-07-09 MED ORDER — METOCLOPRAMIDE HCL 5 MG/ML IJ SOLN: 5.0000 mg | Freq: Three times a day (TID) | INTRAMUSCULAR | Status: DC | PRN

## 2013-07-09 MED ORDER — FLUTICASONE PROPIONATE 50 MCG/ACT NA SUSP
1.0000 | Freq: Every day | NASAL | Status: DC | PRN
  Filled 2013-07-09: qty 16

## 2013-07-09 MED ORDER — ARTIFICIAL TEARS OP OINT
TOPICAL_OINTMENT | OPHTHALMIC | Status: DC | PRN
  Administered 2013-07-09: 1 via OPHTHALMIC

## 2013-07-09 MED ORDER — PROPOFOL 10 MG/ML IV BOLUS
INTRAVENOUS | Status: DC | PRN
  Administered 2013-07-09: 100 mg via INTRAVENOUS

## 2013-07-09 MED ORDER — ONDANSETRON HCL 4 MG/2ML IJ SOLN: 4.0000 mg | Freq: Four times a day (QID) | INTRAMUSCULAR | Status: DC | PRN

## 2013-07-09 MED ORDER — OXYCODONE HCL 5 MG PO TABS: 5.0000 mg | ORAL_TABLET | ORAL | Status: DC | PRN

## 2013-07-09 MED ORDER — SODIUM CHLORIDE 0.9 % IV SOLN: INTRAVENOUS | Status: DC

## 2013-07-09 MED ORDER — LOSARTAN POTASSIUM 50 MG PO TABS
100.0000 mg | ORAL_TABLET | Freq: Every day | ORAL | Status: DC
  Administered 2013-07-10 – 2013-07-11 (×2): 100 mg via ORAL
  Filled 2013-07-09 (×3): qty 2

## 2013-07-09 MED ORDER — ASPIRIN EC 325 MG PO TBEC
325.0000 mg | DELAYED_RELEASE_TABLET | Freq: Every day | ORAL | Status: DC
  Administered 2013-07-10 – 2013-07-14 (×4): 325 mg via ORAL
  Filled 2013-07-09 (×6): qty 1

## 2013-07-09 MED ORDER — LACTATED RINGERS IV SOLN
INTRAVENOUS | Status: DC | PRN
  Administered 2013-07-09: 19:00:00 via INTRAVENOUS

## 2013-07-09 MED ORDER — HYDRALAZINE HCL 50 MG PO TABS
50.0000 mg | ORAL_TABLET | Freq: Four times a day (QID) | ORAL | Status: DC
  Administered 2013-07-09 – 2013-07-14 (×16): 50 mg via ORAL
  Filled 2013-07-09 (×25): qty 1

## 2013-07-09 MED ORDER — DEXAMETHASONE SODIUM PHOSPHATE 4 MG/ML IJ SOLN
INTRAMUSCULAR | Status: DC | PRN
  Administered 2013-07-09: 4 mg via INTRAVENOUS

## 2013-07-09 MED ORDER — 0.9 % SODIUM CHLORIDE (POUR BTL) OPTIME
TOPICAL | Status: DC | PRN
  Administered 2013-07-09: 150 mL

## 2013-07-09 MED ORDER — METOCLOPRAMIDE HCL 10 MG PO TABS: 5.0000 mg | ORAL_TABLET | Freq: Three times a day (TID) | ORAL | Status: DC | PRN

## 2013-07-09 MED ORDER — SODIUM CHLORIDE 0.9 % IV SOLN
INTRAVENOUS | Status: DC
  Administered 2013-07-09: 11:00:00 via INTRAVENOUS

## 2013-07-09 MED ORDER — ONDANSETRON HCL 4 MG/2ML IJ SOLN
INTRAMUSCULAR | Status: DC | PRN
  Administered 2013-07-09: 4 mg via INTRAVENOUS

## 2013-07-09 MED ORDER — PHENOL 1.4 % MT LIQD: 1.0000 | OROMUCOSAL | Status: DC | PRN

## 2013-07-09 MED ORDER — HYDRALAZINE HCL 20 MG/ML IJ SOLN
10.0000 mg | Freq: Four times a day (QID) | INTRAMUSCULAR | Status: DC | PRN
  Administered 2013-07-09 – 2013-07-13 (×4): 10 mg via INTRAVENOUS
  Filled 2013-07-09 (×3): qty 1

## 2013-07-09 SURGICAL SUPPLY — 35 items
BIT DRILL CANN LG 4.3MM (BIT) IMPLANT
CLOTH BEACON ORANGE TIMEOUT ST (SAFETY) ×1 IMPLANT
COVER SURGICAL LIGHT HANDLE (MISCELLANEOUS) ×2 IMPLANT
DRAPE PROXIMA HALF (DRAPES) ×3 IMPLANT
DRAPE STERI IOBAN 125X83 (DRAPES) IMPLANT
DRILL BIT CANN LG 4.3MM (BIT) ×2
DRSG ADAPTIC 3X8 NADH LF (GAUZE/BANDAGES/DRESSINGS) ×2 IMPLANT
DRSG MEPILEX BORDER 4X4 (GAUZE/BANDAGES/DRESSINGS) ×2 IMPLANT
DRSG MEPILEX BORDER 4X8 (GAUZE/BANDAGES/DRESSINGS) IMPLANT
DURAPREP 26ML APPLICATOR (WOUND CARE) ×2 IMPLANT
ELECT REM PT RETURN 9FT ADLT (ELECTROSURGICAL) ×2
ELECTRODE REM PT RTRN 9FT ADLT (ELECTROSURGICAL) ×1 IMPLANT
EVACUATOR 1/8 PVC DRAIN (DRAIN) IMPLANT
GLOVE BIO SURGEON STRL SZ 6.5 (GLOVE) ×1 IMPLANT
GLOVE SURG SS PI 8.0 STRL IVOR (GLOVE) ×3 IMPLANT
GOWN EXTRA PROTECTION XL (GOWNS) ×2 IMPLANT
GOWN STRL NON-REIN LRG LVL3 (GOWN DISPOSABLE) ×5 IMPLANT
GUIDEPIN 3.2X17.5 THRD DISP (PIN) ×1 IMPLANT
GUIDEWIRE BALL NOSE 80CM (WIRE) ×1 IMPLANT
KIT BASIN OR (CUSTOM PROCEDURE TRAY) ×2 IMPLANT
KIT ROOM TURNOVER OR (KITS) ×2 IMPLANT
MANIFOLD NEPTUNE II (INSTRUMENTS) ×2 IMPLANT
NAIL HIP FRACT 130D 9X180 (Orthopedic Implant) ×1 IMPLANT
NS IRRIG 1000ML POUR BTL (IV SOLUTION) ×2 IMPLANT
PACK GENERAL/GYN (CUSTOM PROCEDURE TRAY) ×2 IMPLANT
PAD ARMBOARD 7.5X6 YLW CONV (MISCELLANEOUS) ×4 IMPLANT
SCREW LAG 6.5X80X10.5XLRG ST (Screw) IMPLANT
SCREW LAG 80MM (Screw) ×2 IMPLANT
SPONGE GAUZE 4X4 12PLY (GAUZE/BANDAGES/DRESSINGS) ×2 IMPLANT
STAPLER VISISTAT (STAPLE) ×2 IMPLANT
SUT VIC AB 0 CTB1 27 (SUTURE) ×4 IMPLANT
SUT VIC AB 1 CTB1 27 (SUTURE) ×4 IMPLANT
SUT VIC AB 2-0 CTB1 (SUTURE) ×4 IMPLANT
TAPE STRIPS DRAPE STRL (GAUZE/BANDAGES/DRESSINGS) ×1 IMPLANT
WATER STERILE IRR 1000ML POUR (IV SOLUTION) ×6 IMPLANT

## 2013-07-09 NOTE — Anesthesia Preprocedure Evaluation (Addendum)
Anesthesia Evaluation  Patient identified by MRN, date of birth, ID band Patient awake    Reviewed: Allergy & Precautions, H&P , NPO status , Patient's Chart, lab work & pertinent test results  Airway Mallampati: I TM Distance: >3 FB Neck ROM: Full    Dental   Pulmonary asthma , former smoker,  breath sounds clear to auscultation        Cardiovascular hypertension, Pt. on medications + Peripheral Vascular Disease Rhythm:Regular Rate:Normal  Renal art stenosis   Neuro/Psych Anxiety Dementia S/p back surgery with chronic lbp    GI/Hepatic   Endo/Other    Renal/GU      Musculoskeletal   Abdominal   Peds  Hematology   Anesthesia Other Findings cachectic  Reproductive/Obstetrics                         Anesthesia Physical Anesthesia Plan  ASA: IV  Anesthesia Plan: General   Post-op Pain Management:    Induction: Intravenous  Airway Management Planned: Oral ETT  Additional Equipment:   Intra-op Plan:   Post-operative Plan: Extubation in OR  Informed Consent: I have reviewed the patients History and Physical, chart, labs and discussed the procedure including the risks, benefits and alternatives for the proposed anesthesia with the patient or authorized representative who has indicated his/her understanding and acceptance.     Plan Discussed with: CRNA and Surgeon  Anesthesia Plan Comments:        Anesthesia Quick Evaluation

## 2013-07-09 NOTE — ED Notes (Signed)
Per EMS- pt fell last night while at home with caregiver. Pt has deformity to rt hip and pain with movement and palpation. Pt received 150 mcg of fentanyl en route. Pt also has skin tear to rt elbow.

## 2013-07-09 NOTE — Interval H&P Note (Signed)
History and Physical Interval Note:  07/09/2013 7:02 PM  [name]  has presented today for surgery, with the diagnosis of Right Hip Fx.  The various methods of treatment have been discussed with the patient and family. After consideration of risks, benefits and other options for treatment, the patient has consented to  Procedure(s): INTRAMEDULLARY (IM) NAIL FEMORAL (Right) as a surgical intervention .  The patient's history has been reviewed, patient examined, no change in status, stable for surgery.  I have reviewed the patient's chart and labs.  Questions were answered to the patient's satisfaction.     Robt Okuda C   

## 2013-07-09 NOTE — Interval H&P Note (Signed)
History and Physical Interval Note:  07/09/2013 7:02 PM  [name]  has presented today for surgery, with the diagnosis of Right Hip Fx.  The various methods of treatment have been discussed with the patient and family. After consideration of risks, benefits and other options for treatment, the patient has consented to  Procedure(s): INTRAMEDULLARY (IM) NAIL FEMORAL (Right) as a surgical intervention .  The patient's history has been reviewed, patient examined, no change in status, stable for surgery.  I have reviewed the patient's chart and labs.  Questions were answered to the patient's satisfaction.     Jarett Dralle C

## 2013-07-09 NOTE — ED Notes (Signed)
Patient transported to X-ray 

## 2013-07-09 NOTE — Progress Notes (Signed)
Patient family member, son(Phillip) concerned about outcome of surgery. Does not feel comfortable with definitive surgery until he is able to speak with Dr. Shelle Iron regarding options,risks, and outcomes. Son is also mentioning the idea of hospice care and Dr. Catha Gosselin was notified. However, at this time she does not want to consult hospice until after Ortho consults on patient. Dr. Shelle Iron has been paged twice, awaiting his call back regarding plans for patient at this time. Son given hip fracture education packet if surgery is to ensue.

## 2013-07-09 NOTE — Anesthesia Postprocedure Evaluation (Signed)
Anesthesia Post Note  Patient: [name]  Procedure(s) Performed: Procedure(s) (LRB): INTRAMEDULLARY (IM) NAIL FEMORAL (Right)  Anesthesia type: general  Patient location: PACU  Post pain: Pain level controlled  Post assessment: Patient's Cardiovascular Status Stable  Last Vitals:  Filed Vitals:   07/09/13 2100  BP: 179/57  Pulse: 84  Temp:   Resp: 15    Post vital signs: Reviewed and stable  Level of consciousness: sedated  Complications: No apparent anesthesia complications

## 2013-07-09 NOTE — Progress Notes (Signed)
INITIAL NUTRITION ASSESSMENT  DOCUMENTATION CODES Per approved criteria  -Severe malnutrition in the context of chronic illness -Underweight   INTERVENTION: Supplement diet as appropriate  NUTRITION DIAGNOSIS: Malnutrition related to chronic illness as evidenced by severe fat and muscle wasting.   Goal: Pt to meet >/= 90% of their estimated nutrition needs   Monitor:  Diet advancement, PO intake, weight trend, labs  Reason for Assessment: Hip Fracture Protocol Consult  79 y.o. female  Admitting Dx: Hip fracture, right  ASSESSMENT: Pt admitted with right hip fx after fall at home. Hx obtained from son and cg. Pt lives with retired Lawyer and had been doing well. Pt had been gradually losing weight over the last 1 1/2 years from 100 lb to 90 lb.  Pt ate a snack in the morning with her meds, she would then go back to sleep and wake to eat Breakfast about 10 am. This is a good breakfast. Lunch was a sandwich, she would then have a snack around 2 pm. Dinner would be a protein, starch and vegetable. Pt has drank ensure in the past but is no longer drinking currently. Pt had been able to get up and walk around with a rolling walker/seat.  Pt with hx of dementia. Pt had been on hospice last year but is not currently. Son is a chaplin with hospice. Per son he wants mom to be comfortable but is not sure about surgery.  MD notes pending.   Nutrition Focused Physical Exam:  Subcutaneous Fat:  Orbital Region: severe wasting Upper Arm Region: severe wasting Thoracic and Lumbar Region: severe wasting  Muscle:  Temple Region: severe wasting Clavicle Bone Region: severe wasting Clavicle and Acromion Bone Region: severe wasting Scapular Bone Region: severe wasting Dorsal Hand: severe wasting Patellar Region: severe wasting Anterior Thigh Region: severe wasting Posterior Calf Region: severe wasting  Edema: not present   Height: Ht Readings from Last 1 Encounters:  07/09/13 5\' 4"  (1.626  m)    Weight: Wt Readings from Last 1 Encounters:  07/09/13 90 lb (40.824 kg)    Ideal Body Weight: 54.5 kg   % Ideal Body Weight: 75%  Wt Readings from Last 10 Encounters:  07/09/13 90 lb (40.824 kg)  07/09/13 90 lb (40.824 kg)  05/28/13 90 lb 3.2 oz (40.914 kg)  11/16/12 94 lb 12.8 oz (43.001 kg)  05/27/11 106 lb (48.081 kg)    Usual Body Weight: 100 lb per son  % Usual Body Weight: 90%  BMI:  Body mass index is 15.44 kg/(m^2).  Estimated Nutritional Needs: Kcal: 1200-1300 Protein: 50-65 grams Fluid: > 1.5 L/day  Skin: skin tear on elbow  Diet Order: NPO  EDUCATION NEEDS: -No education needs identified at this time  No intake or output data in the 24 hours ending 07/09/13 1458  Last BM: PTA   Labs:   Recent Labs Lab 07/09/13 1115  NA 138  K 3.2*  CL 99  CO2 28  BUN 15  CREATININE 1.03  CALCIUM 8.7  GLUCOSE 137*    CBG (last 3)  No results found for this basename: GLUCAP,  in the last 72 hours  Scheduled Meds: . sodium chloride   Intravenous STAT  . [START ON 07/10/2013] amLODipine  5 mg Oral Daily  .  ceFAZolin (ANCEF) IV  1 g Intravenous On Call to OR  . cloNIDine  0.3 mg Oral BID  . hydrALAZINE  50 mg Oral QID  . levothyroxine  75 mcg Oral QAC breakfast  . [  START ON 07/10/2013] losartan  100 mg Oral Daily  . potassium chloride  10 mEq Intravenous Q1 Hr x 3    Continuous Infusions: . sodium chloride 100 mL/hr at 07/09/13 1046    Past Medical History  Diagnosis Date  . Hyperlipidemia   . Hypertension   . Arthritis   . Anxiety   . Asthma   . Leg pain     with walking  . Peripheral arterial disease   . Dementia     Past Surgical History  Procedure Laterality Date  . Back surgery      Kendell Bane RD, LDN, CNSC [phone] Pager [phone] After Hours Pager

## 2013-07-09 NOTE — Anesthesia Procedure Notes (Signed)
Procedure Name: Intubation Date/Time: 07/09/2013 7:12 PM Performed by: Julianne Rice Z Pre-anesthesia Checklist: Patient identified, Timeout performed, Emergency Drugs available, Suction available and Patient being monitored Patient Re-evaluated:Patient Re-evaluated prior to inductionOxygen Delivery Method: Circle system utilized Preoxygenation: Pre-oxygenation with 100% oxygen Intubation Type: IV induction and Combination inhalational/ intravenous induction Ventilation: Mask ventilation without difficulty Laryngoscope Size: Mac and 3 Grade View: Grade II Tube type: Oral Tube size: 7.0 mm Number of attempts: 1 Airway Equipment and Method: Stylet Placement Confirmation: ETT inserted through vocal cords under direct vision,  breath sounds checked- equal and bilateral and positive ETCO2 Secured at: 21 cm Tube secured with: Tape Dental Injury: Teeth and Oropharynx as per pre-operative assessment

## 2013-07-09 NOTE — H&P (Signed)
[facility] History and Physical  [name] ZOX:[id number] DOB: 29-Nov-1918 DOA: 07/09/2013  Referring physician:  PCP: Bufford Spikes, DO  Specialists:   Chief Complaint: Fall  HPI: [name] is a 79 y.o. female  With a history of hypertension, hyperlipidemia, dementia, peripheral arterial disease that presents emergency department with her son at her experiencing a fall yesterday evening. It seems the patient lost her stool or in the femoral. Patient was not brought in yesterday as family thought her pain was controlled yesterday. Patient is unable to provide any information at this time.  Review of Systems:  Unobtainable due to patient's current status.  Past Medical History  Diagnosis Date  . Hyperlipidemia   . Hypertension   . Arthritis   . Anxiety   . Asthma   . Leg pain     with walking  . Peripheral arterial disease   . Dementia    Past Surgical History  Procedure Laterality Date  . Back surgery     Social History:  reports that she quit smoking about 56 years ago. Her smoking use included Cigarettes. She smoked 0.00 packs per day. She does not have any smokeless tobacco history on file. She reports that she does not drink alcohol or use illicit drugs. Lives at home with a care giver.    Allergies  Allergen Reactions  . Celebrex [Celecoxib]     unknown  . Epinephrine     unsure  . Lidocaine     unsure  . Lyrica [Pregabalin]     Caused her to spit up excessively and became dehydrated  . Morphine And Related Other (See Comments)    hallucinations  . Sulfa Antibiotics     unsure  . Ultram [Tramadol Hcl] Other (See Comments)    Spit up and become dehydrated    Family History  Problem Relation Age of Onset  . Heart disease Mother   . Heart disease Brother     Prior to Admission medications   Medication Sig Start Date End Date Taking? Authorizing Provider  ALPRAZolam (XANAX) 0.25 MG tablet Take 0.25-0.5 mg by mouth 3 (three) times daily as  needed for anxiety.   Yes Historical Provider, MD  amLODipine (NORVASC) 5 MG tablet Take 5 mg by mouth daily.   Yes Historical Provider, MD  cloNIDine (CATAPRES) 0.3 MG tablet Take 0.3 mg by mouth 2 (two) times daily.   Yes Historical Provider, MD  fluticasone (FLONASE) 50 MCG/ACT nasal spray Place 1 spray into both nostrils daily as needed for allergies or rhinitis.   Yes Historical Provider, MD  hydrALAZINE (APRESOLINE) 50 MG tablet Take 1 tablet (50 mg total) by mouth 4 (four) times daily. 05/28/13  Yes Tiffany L Reed, DO  ibuprofen (ADVIL,MOTRIN) 200 MG tablet Take 200 mg by mouth 3 (three) times daily.   Yes Historical Provider, MD  levothyroxine (SYNTHROID, LEVOTHROID) 75 MCG tablet Take 1 tablet (75 mcg total) by mouth 1 day or 1 dose. 05/28/13  Yes Tiffany L Reed, DO  losartan (COZAAR) 100 MG tablet Take 1 tablet (100 mg total) by mouth daily. Take one tablet once daily for blood pressure 05/28/13  Yes Tiffany L Reed, DO  oxyCODONE (OXY IR/ROXICODONE) 5 MG immediate release tablet Take 5 mg by mouth 3 (three) times daily.   Yes Historical Provider, MD  oxyCODONE (OXY IR/ROXICODONE) 5 MG immediate release tablet Take 5 mg by mouth daily as needed for severe pain (if patient wakes up from pain).   Yes  Historical Provider, MD  polyethylene glycol (MIRALAX / GLYCOLAX) packet Take 17 g by mouth daily as needed for mild constipation.   Yes Historical Provider, MD   Physical Exam: Filed Vitals:   07/09/13 1115  BP: 168/56  Pulse: 60  Temp:   Resp: 12     General: Well developed, well nourished, mild distress, appears stated age  HEENT: NCAT, PERRLA, EOMI, Anicteic Sclera, mucous membranes moist. No pharyngeal erythema or exudates  Neck: Supple, no JVD, no masses  Cardiovascular: S1 S2 auscultated, no rubs, murmurs or gallops. Regular rate and rhythm.  Respiratory: Clear to auscultation bilaterally with equal chest rise  Abdomen: Soft, nontender, nondistended, + bowel  sounds  Extremities: warm dry without cyanosis clubbing or edema.  Pain with any movement of the right lower extremity, RLE externally rotated.  Neuro: Awake and alert. Not oriented. Unable to assess at this time.  Skin: Without rashes exudates or nodules  Psych: Unable to assess.  Labs on Admission:  Basic Metabolic Panel:  Recent Labs Lab 07/09/13 1115  NA 138  K 3.2*  CL 99  CO2 28  GLUCOSE 137*  BUN 15  CREATININE 1.03  CALCIUM 8.7   Liver Function Tests: No results found for this basename: AST, ALT, ALKPHOS, BILITOT, PROT, ALBUMIN,  in the last 168 hours No results found for this basename: LIPASE, AMYLASE,  in the last 168 hours No results found for this basename: AMMONIA,  in the last 168 hours CBC:  Recent Labs Lab 07/09/13 1115  WBC 7.6  NEUTROABS 6.1  HGB 10.7*  HCT 32.5*  MCV 90.0  PLT 216   Cardiac Enzymes: No results found for this basename: CKTOTAL, CKMB, CKMBINDEX, TROPONINI,  in the last 168 hours  BNP (last 3 results) No results found for this basename: PROBNP,  in the last 8760 hours CBG: No results found for this basename: GLUCAP,  in the last 168 hours  Radiological Exams on Admission: Dg Pelvis Portable  07/09/2013   CLINICAL DATA:  Fall. Obvious right hip deformity at physical examination.  EXAM: PORTABLE PELVIS 1-2 VIEWS  COMPARISON:  None.  FINDINGS: Comminuted intertrochanteric right femoral neck fracture. Right hip joint anatomically aligned with marked joint space narrowing. Remainder of the pelvis not optimally imaged.  IMPRESSION: Comminuted intertrochanteric right femoral neck fracture.   Electronically Signed   By: Hulan Saas M.D.   On: 07/09/2013 10:30   Dg Chest Portable 1 View  07/09/2013   CLINICAL DATA:  Fall  EXAM: PORTABLE CHEST - 1 VIEW  COMPARISON:  05/20/2012  FINDINGS: Cardiomediastinal silhouette is stable. Hyperinflation. Mild degenerative changes thoracic spine. Atherosclerotic calcifications of thoracic aorta.  No acute infiltrate or pulmonary edema.  IMPRESSION: No active disease.  Hyperinflation.   Electronically Signed   By: Natasha Mead M.D.   On: 07/09/2013 11:20    EKG: Independently reviewed. Sinus bradycardia, rate 53, PAC  Assessment/Plan Principal Problem:   Hip fracture, right Active Problems:   PAD (peripheral artery disease)   Hyperlipidemia   Hypertension   Alzheimer's dementia   Chronic pain syndrome   Asthma   Anxiety   Closed right hip fracture   Acute intertrochanteric right hip fracture Patient will be admitted to the medical surgical floor. She will be given pain control. Orthopedics has been consulted by the emergency department. Patient will be made n.p.o. at this time for possible surgery. Patient currently is on SCDs.  Uncontrolled hypertension   likely secondary to pain. We'll continue to monitor patient  will be receiving pain medication. Will also resume her home medications of amlodipine, clonidine, hydralazine, losartan.  Hypothyroidism Continue levothyroxine  Anxiety Continue Xanax  Asthma Currently on no home medications, will continue to monitor, patient is. Short of breath and has no wheezing.  Peripheral arterial disease Currently stable.  Alzheimer's dementia Currently on no medications. Will continue to monitor.   DVT prophylaxis:  SCDs  Code Status: DNR  Condition: Guarded  Family Communication: son at bedside. Admission, patients condition and plan of care including tests being ordered have been discussed with the patient's son who indicates understanding and agrees with the plan and Code Status.  Disposition Plan: Admitted.  Surgery consulted for intervention.  Time spent: 45 minutes  Monda Chastain D.O. [facility] Pager [phone]  If 7PM-7AM, please contact night-coverage [email or web address] Password [facility] 07/09/2013, 12:56 PM

## 2013-07-09 NOTE — Consult Note (Signed)
Reason for Consult: R hip fx Referring Physician: Dr. Radford Pax (ER)  Noel Christmas [name] is an 79 y.o. female.  HPI: Pt experienced a fall yesterday onto her R hip, was placed on the couch afterwards, still unable to weightbear today, was brought to the ER for evaluation, found to have R hip fx. She also fell on her right elbow. She has hx of dementia, hx obtained from ER records.  Past Medical History  Diagnosis Date  . Hyperlipidemia   . Hypertension   . Arthritis   . Anxiety   . Asthma   . Leg pain     with walking  . Peripheral arterial disease   . Dementia     Past Surgical History  Procedure Laterality Date  . Back surgery      Family History  Problem Relation Age of Onset  . Heart disease Mother   . Heart disease Brother     Social History:  reports that she quit smoking about 56 years ago. Her smoking use included Cigarettes. She smoked 0.00 packs per day. She does not have any smokeless tobacco history on file. She reports that she does not drink alcohol or use illicit drugs.  Allergies:  Allergies  Allergen Reactions  . Celebrex [Celecoxib]     unknown  . Epinephrine     unsure  . Lidocaine     unsure  . Lyrica [Pregabalin]     Caused her to spit up excessively and became dehydrated  . Morphine And Related Other (See Comments)    hallucinations  . Sulfa Antibiotics     unsure  . Ultram [Tramadol Hcl] Other (See Comments)    Spit up and become dehydrated    Medications: I have reviewed the patient's current medications.  Results for orders placed during the hospital encounter of 07/09/13 (from the past 48 hour(s))  CBC WITH DIFFERENTIAL     Status: Abnormal   Collection Time    07/09/13 11:15 AM      Result Value Range   WBC 7.6  4.0 - 10.5 K/uL   RBC 3.61 (*) 3.87 - 5.11 MIL/uL   Hemoglobin 10.7 (*) 12.0 - 15.0 g/dL   HCT 16.1 (*) 09.6 - 04.5 %   MCV 90.0  78.0 - 100.0 fL   MCH 29.6  26.0 - 34.0 pg   MCHC 32.9  30.0 - 36.0 g/dL   RDW 40.9  81.1 -  91.4 %   Platelets 216  150 - 400 K/uL   Neutrophils Relative % 81 (*) 43 - 77 %   Neutro Abs 6.1  1.7 - 7.7 K/uL   Lymphocytes Relative 10 (*) 12 - 46 %   Lymphs Abs 0.8  0.7 - 4.0 K/uL   Monocytes Relative 9  3 - 12 %   Monocytes Absolute 0.7  0.1 - 1.0 K/uL   Eosinophils Relative 0  0 - 5 %   Eosinophils Absolute 0.0  0.0 - 0.7 K/uL   Basophils Relative 0  0 - 1 %   Basophils Absolute 0.0  0.0 - 0.1 K/uL  BASIC METABOLIC PANEL     Status: Abnormal   Collection Time    07/09/13 11:15 AM      Result Value Range   Sodium 138  135 - 145 mEq/L   Potassium 3.2 (*) 3.5 - 5.1 mEq/L   Chloride 99  96 - 112 mEq/L   CO2 28  19 - 32 mEq/L   Glucose, Bld 137 (*)  70 - 99 mg/dL   BUN 15  6 - 23 mg/dL   Creatinine, Ser 1.61  0.50 - 1.10 mg/dL   Calcium 8.7  8.4 - 09.6 mg/dL   GFR calc non Af Amer 46 (*) >90 mL/min   GFR calc Af Amer 53 (*) >90 mL/min   Comment: (NOTE)     The eGFR has been calculated using the CKD EPI equation.     This calculation has not been validated in all clinical situations.     eGFR's persistently <90 mL/min signify possible Chronic Kidney     Disease.    Dg Hip Complete Right  07/09/2013   CLINICAL DATA:  Right hip fracture.  EXAM: RIGHT HIP - COMPLETE 2+ VIEW  COMPARISON:  None.  FINDINGS: Acute intertrochanteric fracture of the proximal right femur present with displacement, angulation and impaction. No dislocation identified. No other pelvic fractures are seen.  IMPRESSION: Acute intertrochanteric right hip fracture.   Electronically Signed   By: Irish Lack M.D.   On: 07/09/2013 13:00   Dg Pelvis Portable  07/09/2013   CLINICAL DATA:  Fall. Obvious right hip deformity at physical examination.  EXAM: PORTABLE PELVIS 1-2 VIEWS  COMPARISON:  None.  FINDINGS: Comminuted intertrochanteric right femoral neck fracture. Right hip joint anatomically aligned with marked joint space narrowing. Remainder of the pelvis not optimally imaged.  IMPRESSION: Comminuted  intertrochanteric right femoral neck fracture.   Electronically Signed   By: Hulan Saas M.D.   On: 07/09/2013 10:30   Dg Chest Portable 1 View  07/09/2013   CLINICAL DATA:  Fall  EXAM: PORTABLE CHEST - 1 VIEW  COMPARISON:  05/20/2012  FINDINGS: Cardiomediastinal silhouette is stable. Hyperinflation. Mild degenerative changes thoracic spine. Atherosclerotic calcifications of thoracic aorta. No acute infiltrate or pulmonary edema.  IMPRESSION: No active disease.  Hyperinflation.   Electronically Signed   By: Natasha Mead M.D.   On: 07/09/2013 11:20    Review of Systems  Constitutional: Negative.   HENT: Negative.   Eyes: Negative.   Respiratory: Negative.   Cardiovascular: Negative.   Gastrointestinal: Negative.   Genitourinary: Negative.   Musculoskeletal: Positive for joint pain.  Skin: Negative.   Neurological: Negative.   Endo/Heme/Allergies: Negative.   Psychiatric/Behavioral: Negative.    Blood pressure 168/56, pulse 60, temperature 98.6 F (37 C), temperature source Oral, resp. rate 12, height 5\' 4"  (1.626 m), weight 40.824 kg (90 lb), SpO2 100.00%. Physical Exam  Constitutional: She is oriented to person, place, and time. She appears well-developed and well-nourished.  HENT:  Head: Normocephalic and atraumatic.  Eyes: Conjunctivae and EOM are normal. Pupils are equal, round, and reactive to light.  Neck: Normal range of motion. Neck supple.  Cardiovascular: Normal rate and regular rhythm.   Respiratory: Effort normal and breath sounds normal.  GI: Soft. Bowel sounds are normal.  Musculoskeletal:  R leg shortened, externally rotated Pain with ROM R hip  Skin tear R elbow  Neurological: She is alert and oriented to person, place, and time. She has normal reflexes.  Skin: Skin is warm and dry.   xrays pelvis and R hip with displaced subtroch fx right hip, no extension into the femoral neck or head.  Assessment/Plan: R subtroch hip fx  Plan admit to medical service  for medical mgmt and pre-op clearance Keep NPO for surgery later today, long IM nail right femur Pt with decreased renal function in 1 kidney, non-functional other kidney, plan foley to monitor I&O, will plan to keep slightly  hypertensive intra-op to maintain perfusion, Dr. Shelle Iron discussed with anesthesia Will require post-op anticoagulation, plan lovenox x 2 weeks post-op  Dr. Shelle Iron discussed risks, complications, and alternatives with the pt and family, including but not limited to DVT, PE, infx, bleeding, failure of procedure, need for secondary procedure, anesthesia risk, even death. They desire to proceed. All questions answered.  Andrez Grime M. for Dr. Shelle Iron 07/09/2013, 1:06 PM

## 2013-07-09 NOTE — Transfer of Care (Signed)
Immediate Anesthesia Transfer of Care Note  Patient: [name]  Procedure(s) Performed: Procedure(s): INTRAMEDULLARY (IM) NAIL FEMORAL (Right)  Patient Location: PACU  Anesthesia Type:General  Level of Consciousness: awake and patient cooperative  Airway & Oxygen Therapy: Patient Spontanous Breathing and Patient connected to nasal cannula oxygen  Post-op Assessment: Report given to PACU RN and Post -op Vital signs reviewed and stable  Post vital signs: Reviewed and stable  Complications: No apparent anesthesia complications

## 2013-07-09 NOTE — Brief Op Note (Signed)
07/09/2013  8:18 PM  PATIENT:  [name]  79 y.o. female  PRE-OPERATIVE DIAGNOSIS:  Right Hip Fx.  POST-OPERATIVE DIAGNOSIS:  same  PROCEDURE:  Procedure(s): INTRAMEDULLARY (IM) NAIL FEMORAL (Right)  SURGEON:  Surgeon(s) and Role:    * Javier Docker, MD - Primary  PHYSICIAN ASSISTANT:   ASSISTANTS: Bissell   ANESTHESIA:   general  EBL:  Total I/O In: -  Out: 105 [Urine:55; Blood:50]  BLOOD ADMINISTERED:none  DRAINS: none   LOCAL MEDICATIONS USED:  NONE  SPECIMEN:  No Specimen  DISPOSITION OF SPECIMEN:  N/A  COUNTS:  YES  TOURNIQUET:  * No tourniquets in log *  DICTATION: .Other Dictation: Dictation Number [id number]  PLAN OF CARE: Admit to inpatient   PATIENT DISPOSITION:  PACU - hemodynamically stable.   Delay start of Pharmacological VTE agent (>24hrs) due to surgical blood loss or risk of bleeding: no

## 2013-07-09 NOTE — Discharge Instructions (Signed)
Partial weightbearing right leg (50%) with walker for support Keep incisions clean and dry, change dressings daily Follow up with Dr. Shelle Iron in 10-14 days for staple removal and xrays Take aspirin 325mg  daily to prevent blood clots

## 2013-07-09 NOTE — ED Notes (Signed)
Radiology called about hip xray

## 2013-07-09 NOTE — ED Provider Notes (Signed)
CSN: [id number]     Arrival date & time 07/09/13  1914 History   First MD Initiated Contact with Patient 07/09/13 [phone]     Chief Complaint  Patient presents with  . Fall    HPI Patient fell at her nursing home facility last night.  They lifted her up and put her back in bed.  She has an obvious deformity to her right hip.  She has pain to that area.  She has no previous history of hip fracture or hip replacement.  Patient is a DO NOT RESUSCITATE.  She has a past medical history of dementia. Past Medical History  Diagnosis Date  . Hyperlipidemia   . Hypertension   . Arthritis   . Anxiety   . Asthma   . Leg pain     with walking  . Peripheral arterial disease   . Dementia    Past Surgical History  Procedure Laterality Date  . Back surgery    . Femur im nail Right 07/09/2013    Procedure: INTRAMEDULLARY (IM) NAIL FEMORAL;  Surgeon: Javier Docker, MD;  Location: [facility] OR;  Service: Orthopedics;  Laterality: Right;   Family History  Problem Relation Age of Onset  . Heart disease Mother   . Heart disease Brother    History  Substance Use Topics  . Smoking status: Former Smoker    Types: Cigarettes    Quit date: 07/19/1957  . Smokeless tobacco: Not on file  . Alcohol Use: No   OB History   Grav Para Term Preterm Abortions TAB SAB Ect Mult Living                 Review of Systems  Unable to perform ROS: Dementia    Allergies  Celebrex; Epinephrine; Lidocaine; Lyrica; Morphine and related; Sulfa antibiotics; and Ultram  Home Medications   Current Outpatient Rx  Name  Route  Sig  Dispense  Refill  . ALPRAZolam (XANAX) 0.25 MG tablet   Oral   Take 0.25-0.5 mg by mouth 3 (three) times daily as needed for anxiety.         . cloNIDine (CATAPRES) 0.3 MG tablet   Oral   Take 0.3 mg by mouth 2 (two) times daily.         . fluticasone (FLONASE) 50 MCG/ACT nasal spray   Each Nare   Place 1 spray into both nostrils daily as needed for allergies or rhinitis.         . hydrALAZINE (APRESOLINE) 50 MG tablet   Oral   Take 1 tablet (50 mg total) by mouth 4 (four) times daily.   360 tablet   4   . ibuprofen (ADVIL,MOTRIN) 200 MG tablet   Oral   Take 200 mg by mouth 3 (three) times daily.         Marland Kitchen levothyroxine (SYNTHROID, LEVOTHROID) 75 MCG tablet   Oral   Take 1 tablet (75 mcg total) by mouth 1 day or 1 dose.   90 tablet   4   . polyethylene glycol (MIRALAX / GLYCOLAX) packet   Oral   Take 17 g by mouth daily as needed for mild constipation.         Marland Kitchen amLODipine (NORVASC) 10 MG tablet   Oral   Take 1 tablet (10 mg total) by mouth daily.   31 tablet   0   . aspirin EC 325 MG tablet   Oral   Take 1 tablet (325 mg total) by mouth daily.  30 tablet   0   . guaiFENesin (MUCINEX) 600 MG 12 hr tablet   Oral   Take 1 tablet (600 mg total) by mouth 2 (two) times daily.   12 tablet   0   . levofloxacin (LEVAQUIN) 750 MG tablet   Oral   Take 1 tablet (750 mg total) by mouth daily. Take for 6 days then stop.   6 tablet   0   . Maltodextrin-Xanthan Gum (RESOURCE THICKENUP CLEAR) POWD   Oral   Take 120 g by mouth as needed (nectar thick liquids.).   10 Can   0   . oxyCODONE-acetaminophen (PERCOCET) 5-325 MG per tablet   Oral   Take 1-2 tablets by mouth every 4 (four) hours as needed.   40 tablet   0    BP 161/84  Pulse 73  Temp(Src) 98.3 F (36.8 C) (Oral)  Resp 20  Ht 5\' 4"  (1.626 m)  Wt 90 lb (40.824 kg)  BMI 15.44 kg/m2  SpO2 95% Physical Exam  Nursing note and vitals reviewed. Constitutional: She appears well-developed and well-nourished. No distress.  HENT:  Head: Normocephalic and atraumatic.  Eyes: Pupils are equal, round, and reactive to light.  Neck: Normal range of motion.  Cardiovascular: Normal rate and intact distal pulses.   Pulmonary/Chest: No respiratory distress.  Abdominal: Normal appearance. She exhibits no distension.  Musculoskeletal: She exhibits tenderness.       Right hip: She exhibits  decreased range of motion, tenderness, bony tenderness and deformity.  Neurological: She is alert. No cranial nerve deficit.  Skin: Skin is warm and dry. No rash noted.  Psychiatric: She has a normal mood and affect. Her behavior is normal.    ED Course  Procedures (including critical care time)  Discussed with orthopedics who requested admission to hospitalist service. Labs Review Labs Reviewed  SURGICAL PCR SCREEN - Abnormal; Notable for the following:    MRSA, PCR POSITIVE (*)    Staphylococcus aureus POSITIVE (*)    All other components within normal limits  CBC WITH DIFFERENTIAL - Abnormal; Notable for the following:    RBC 3.61 (*)    Hemoglobin 10.7 (*)    HCT 32.5 (*)    Neutrophils Relative % 81 (*)    Lymphocytes Relative 10 (*)    All other components within normal limits  BASIC METABOLIC PANEL - Abnormal; Notable for the following:    Potassium 3.2 (*)    Glucose, Bld 137 (*)    GFR calc non Af Amer 46 (*)    GFR calc Af Amer 53 (*)    All other components within normal limits  CBC - Abnormal; Notable for the following:    RBC 3.20 (*)    Hemoglobin 9.5 (*)    HCT 28.6 (*)    All other components within normal limits  BASIC METABOLIC PANEL - Abnormal; Notable for the following:    Glucose, Bld 143 (*)    GFR calc non Af Amer 46 (*)    GFR calc Af Amer 53 (*)    All other components within normal limits  CBC - Abnormal; Notable for the following:    RBC 2.73 (*)    Hemoglobin 8.2 (*)    HCT 24.6 (*)    All other components within normal limits  BASIC METABOLIC PANEL - Abnormal; Notable for the following:    Glucose, Bld 113 (*)    Creatinine, Ser 1.12 (*)    Calcium 7.9 (*)  GFR calc non Af Amer 41 (*)    GFR calc Af Amer 48 (*)    All other components within normal limits  CBC - Abnormal; Notable for the following:    RBC 2.63 (*)    Hemoglobin 8.0 (*)    HCT 23.7 (*)    Platelets 136 (*)    All other components within normal limits  CBC -  Abnormal; Notable for the following:    WBC 11.6 (*)    RBC 2.53 (*)    Hemoglobin 7.7 (*)    HCT 22.9 (*)    Platelets 140 (*)    All other components within normal limits  BASIC METABOLIC PANEL - Abnormal; Notable for the following:    Glucose, Bld 130 (*)    BUN 39 (*)    Creatinine, Ser 1.50 (*)    Calcium 7.8 (*)    GFR calc non Af Amer 29 (*)    GFR calc Af Amer 34 (*)    All other components within normal limits  IRON AND TIBC - Abnormal; Notable for the following:    Iron <10 (*)    All other components within normal limits  URINALYSIS, ROUTINE W REFLEX MICROSCOPIC - Abnormal; Notable for the following:    APPearance CLOUDY (*)    Protein, ur 30 (*)    All other components within normal limits  URINE MICROSCOPIC-ADD ON - Abnormal; Notable for the following:    Bacteria, UA FEW (*)    Casts GRANULAR CAST (*)    All other components within normal limits  BASIC METABOLIC PANEL - Abnormal; Notable for the following:    BUN 34 (*)    Creatinine, Ser 1.14 (*)    Calcium 8.0 (*)    GFR calc non Af Amer 40 (*)    GFR calc Af Amer 47 (*)    All other components within normal limits  BASIC METABOLIC PANEL - Abnormal; Notable for the following:    Glucose, Bld 106 (*)    BUN 30 (*)    Calcium 7.7 (*)    GFR calc non Af Amer 59 (*)    GFR calc Af Amer 68 (*)    All other components within normal limits  CBC - Abnormal; Notable for the following:    RDW 15.7 (*)    All other components within normal limits  URINE CULTURE  VITAMIN B12  FOLATE  FERRITIN  SODIUM, URINE, RANDOM  CREATININE, URINE, RANDOM  CBC  PREPARE RBC (CROSSMATCH)  TYPE AND SCREEN  ABO/RH   Imaging Review No results found.  EKG Interpretation    Date/Time:  Monday July 09 2013 09:53:07 EST Ventricular Rate:  53 PR Interval:  108 QRS Duration: 74 QT Interval:  455 QTC Calculation: 427 R Axis:   9 Text Interpretation:  Sinus rhythm Atrial premature complexes Short PR interval LVH with  secondary repolarization abnormality ED PHYSICIAN INTERPRETATION AVAILABLE IN [facility] Confirmed by TEST, RECORD ([id number]) on 07/11/2013 10:11:57 AM            MDM   1. Hip fracture, right, closed, initial encounter   2. Alzheimer's dementia   3. Chronic pain syndrome   4. Hyperlipidemia   5. Hypertension   6. PAD (peripheral artery disease)   7. Palliative care encounter   8. Weakness generalized   9. Anemia   10. ARF (acute renal failure)   11. HCAP (healthcare-associated pneumonia)        Nelia Shi, MD  07/15/13 2119 

## 2013-07-10 ENCOUNTER — Encounter (HOSPITAL_COMMUNITY): Payer: Self-pay | Admitting: Specialist

## 2013-07-10 DIAGNOSIS — R5381 Other malaise: Secondary | ICD-10-CM

## 2013-07-10 DIAGNOSIS — R531 Weakness: Secondary | ICD-10-CM | POA: Diagnosis present

## 2013-07-10 DIAGNOSIS — Z515 Encounter for palliative care: Secondary | ICD-10-CM

## 2013-07-10 LAB — BASIC METABOLIC PANEL
BUN: 16 mg/dL (ref 6–23)
CO2: 26 mEq/L (ref 19–32)
Calcium: 8.4 mg/dL (ref 8.4–10.5)
Chloride: 103 mEq/L (ref 96–112)
Glucose, Bld: 143 mg/dL — ABNORMAL HIGH (ref 70–99)
Potassium: 3.9 mEq/L (ref 3.5–5.1)
Sodium: 141 mEq/L (ref 135–145)

## 2013-07-10 LAB — CBC
Hemoglobin: 9.5 g/dL — ABNORMAL LOW (ref 12.0–15.0)
MCH: 29.7 pg (ref 26.0–34.0)
Platelets: 207 10*3/uL (ref 150–400)
RBC: 3.2 MIL/uL — ABNORMAL LOW (ref 3.87–5.11)
WBC: 5.7 10*3/uL (ref 4.0–10.5)

## 2013-07-10 MED ORDER — CHLORHEXIDINE GLUCONATE CLOTH 2 % EX PADS
6.0000 | MEDICATED_PAD | Freq: Every day | CUTANEOUS | Status: DC
  Administered 2013-07-10 – 2013-07-12 (×3): 6 via TOPICAL

## 2013-07-10 MED ORDER — WHITE PETROLATUM GEL
Status: AC
  Filled 2013-07-10: qty 5

## 2013-07-10 MED ORDER — MUPIROCIN 2 % EX OINT
TOPICAL_OINTMENT | Freq: Two times a day (BID) | CUTANEOUS | Status: DC
  Administered 2013-07-10 – 2013-07-14 (×8): via NASAL
  Filled 2013-07-10: qty 22

## 2013-07-10 NOTE — Evaluation (Signed)
Clinical/Bedside Swallow Evaluation Patient Details  Name: [name] MRN: [id number] Date of Birth: 1918-12-05  Today's Date: 07/10/2013 Time: 5366-4403 SLP Time Calculation (min): 15 min  Past Medical History:  Past Medical History  Diagnosis Date  . Hyperlipidemia   . Hypertension   . Arthritis   . Anxiety   . Asthma   . Leg pain     with walking  . Peripheral arterial disease   . Dementia    Past Surgical History:  Past Surgical History  Procedure Laterality Date  . Back surgery     HPI:  79 y.o. female with a history of hypertension, hyperlipidemia, dementia, peripheral arterial disease presented to emergency department with her son 12/21 after falling and sustaining a right hip fx.  Underwent INTRAMEDULLARY (IM) NAIL FEMORAL on 12/22. Post surgery, noted to exhibit coughing in conjunction with PO intake, hence SLP swallow eval ordered.       Assessment / Plan / Recommendation Clinical Impression  Pt with baseline dementia, s/p  hip surgery with acute reversible dysphagia marked by consistent coughing, likely intermittent aspiration, after consumption of thin liquids.  Pt confused, word-retrieval deficits; unsure of baseline deficits.  Demonstrated adequate awareness of POs and functional mastication.    Recommend downgrading diet to Dysphagia 3 with nectar-thick liquids for now, with the anticipation of improvement in swallow function in the next several days.  Administer meds whole in puree.     Aspiration Risk  Mild    Diet Recommendation Dysphagia 3 (Mechanical Soft);Nectar-thick liquid   Liquid Administration via: Cup Medication Administration: Whole meds with puree Supervision: Full supervision/cueing for compensatory strategies Compensations: Slow rate;Small sips/bites Postural Changes and/or Swallow Maneuvers: Seated upright 90 degrees;Upright 30-60 min after meal    Other  Recommendations Oral Care Recommendations: Oral care BID   Follow Up  Recommendations  None    Frequency and Duration min 3x week  2 weeks   Pertinent Vitals/Pain After eval, pt desaturated to 78% with nasal cannula in place.  Notified RN.  Sats then increased back to 100%    SLP Swallow Goals  see care plan   Swallow Study Prior Functional Status  Type of Home: House Available Help at Discharge: Personal care attendant;Available 24 hours/day    General Date of Onset: 07/10/13 HPI: 79 y.o. female with a history of hypertension, hyperlipidemia, dementia, peripheral arterial disease presented to emergency department with her son 12/21 after falling and sustaining a right hip fx.  Underwent INTRAMEDULLARY (IM) NAIL FEMORAL on 12/22. Post surgery, noted to exhibit coughing in conjunction with PO intake, hence SLP swallow eval ordered.     Type of Study: Bedside swallow evaluation Previous Swallow Assessment: none per records Diet Prior to this Study: Regular;Thin liquids Temperature Spikes Noted: No Respiratory Status: Nasal cannula History of Recent Intubation: Yes Length of Intubations (days): 1 days (for hip surgery) Behavior/Cognition: Alert;Confused Oral Cavity - Dentition: Missing dentition Self-Feeding Abilities: Needs assist Patient Positioning: Upright in bed Baseline Vocal Quality: Clear Volitional Cough: Strong Volitional Swallow: Unable to elicit    Oral/Motor/Sensory Function Overall Oral Motor/Sensory Function: Appears within functional limits for tasks assessed   Ice Chips Ice chips: Within functional limits Presentation: Spoon   Thin Liquid Thin Liquid: Impaired Presentation: Cup;Straw Pharyngeal  Phase Impairments: Suspected delayed Swallow;Multiple swallows;Cough - Immediate    Nectar Thick Nectar Thick Liquid: Within functional limits Presentation: Cup   Honey Thick Honey Thick Liquid: Not tested   Puree Puree: Within functional limits Presentation: Self Fed  Solid   Alexander Mcauley L. [place], [place] CCC/SLP Pager [phone]     Solid: Within functional limits Presentation: Lennon Alstrom Laurice 07/10/2013,1:56 PM

## 2013-07-10 NOTE — Progress Notes (Signed)
Subjective: 1 Day Post-Op Procedure(s) (LRB): INTRAMEDULLARY (IM) NAIL FEMORAL (Right) Patient reports pain as mild.  Pt resting comfortably in her room with her son this AM. Seen in AM rounds for Dr. Shelle Iron. Reports she did wince in pain once overnight but has not complained much of pain. Denies CP or SOB.   Objective: Vital signs in last 24 hours: Temp:  [97.8 F (36.6 C)-98.6 F (37 C)] 98.6 F (37 C) (12/23 0714) Pulse Rate:  [53-101] 82 (12/23 0714) Resp:  [11-18] 18 (12/23 0714) BP: (128-209)/(41-104) 146/76 mmHg (12/23 0714) SpO2:  [84 %-100 %] 99 % (12/23 0714) Weight:  [40.824 kg (90 lb)] 40.824 kg (90 lb) (12/22 1004)  Intake/Output from previous day: 12/22 0701 - 12/23 0700 In: 970 [P.O.:120; I.V.:650; IV Piggyback:200] Out: 200 [Urine:125; Blood:75] Intake/Output this shift:     Recent Labs  07/09/13 1115 07/10/13 0615  HGB 10.7* 9.5*    Recent Labs  07/09/13 1115 07/10/13 0615  WBC 7.6 5.7  RBC 3.61* 3.20*  HCT 32.5* 28.6*  PLT 216 207    Recent Labs  07/09/13 1115 07/10/13 0615  NA 138 141  K 3.2* 3.9  CL 99 103  CO2 28 PENDING  BUN 15 PENDING  CREATININE 1.03 1.03  GLUCOSE 137* PENDING  CALCIUM 8.7 PENDING   No results found for this basename: LABPT, INR,  in the last 72 hours  Neurologically intact ABD soft Neurovascular intact Sensation intact distally Intact pulses distally Dorsiflexion/Plantar flexion intact Incision: dressing C/D/I and no drainage No cellulitis present Compartment soft no calf pain or sign of DVT  Assessment/Plan: 1 Day Post-Op Procedure(s) (LRB): INTRAMEDULLARY (IM) NAIL FEMORAL (Right) Advance diet Up with therapy D/C IV fluids PWB RLE, walker for support Discussed plan for D/C home with HHPT vs SNF depending on her progress with PT, when stable per medical service ASA for DVT ppx Will discuss with Dr. Elissa Lovett, Dayna Barker. 07/10/2013, 7:57 AM

## 2013-07-10 NOTE — Consult Note (Signed)
Patient [name]      DOB: [birth year]      JWJ:[id number]     Consult Note from the Palliative Medicine Team at [facility]    Consult Requested by: Dr Janee Morn     PCP: Bufford Spikes, DO Reason for Consultation:Clarification of GOC and options     Phone Number:[phone]  Assessment of patients Current state: 79 yo female PMH  of hypertension, hyperlipidemia, dementia, peripheral arterial disease that presents emergency department with her son at her experiencing a fall yesterday evening. S/P hip fracture repair.   Family focus remains comfort, hopeful to dc home   Consult is for review of medical treatment options, clarification of goals of care and end of life issues, disposition and options, and symptom recommendation.  This NP [name] reviewed medical records, received report from team, assessed the patient and then meet at the patient's bedside along with her son [name] # [phone] [facility] with [facility] and very familiar with system)    to discuss diagnosis prognosis, GOC, EOL wishes disposition and options.   A detailed discussion was had today regarding advanced directives.  Concepts specific to code status, artifical feeding and hydration, continued IV antibiotics and rehospitalization was had.  The difference between a aggressive medical intervention path  and a palliative comfort care path for this patient at this time was had.  Values and goals of care important to patient and family were attempted to be elicited.  Concept of Hospice and Palliative Care were discussed  Natural trajectory and expectations at EOL were discussed.  Questions and concerns addressed.  Hard Choices booklet left for review. Family encouraged to call with questions or concerns.  PMT will continue to support holistically.  MOST form completed    Goals of Care: 1.  Code Status:  DNR/DNI-comfort is main focus of care   2. Scope of Treatment: 1. Vital Signs   perunit  2. Respiratory/Oxygen:as needed for comfort 3. Nutritional Support/Tube Feeds:no artificial feeding now or in the future 4. Antibiotics:  Yes until dc then make decsion when/if infection occurs 5. Blood Products:none 6. XLK:GMWN 7. Labs:until dc then no further labs 8. Telemetry:none 9. Consults:none   4. Disposition:  Hopeful for dc home with HH for rehabilitation at home.   I educated family on how to self refer to hospice when ready.   3. Symptom Management:   1. Weakness: Home with Home Health rehabilitation 2. Pain:  Oxycodone 5-10 mg every 4 hrs prn  4. Psychosocial:  Emotional support offered to son at bedside.  5. Spiritual:  Strong community and family support    Patient Documents Completed or Given: Document Given Completed  Advanced Directives Pkt    MOST  yes  DNR    Gone from My Sight    Hard Choices yes     Brief HPI:  79 yo female PMH  of hypertension, hyperlipidemia, dementia, peripheral arterial disease that presents emergency department with her son at her experiencing a fall yesterday evening. Living at [place] S/P hip fracture repair.   Family focus remains comfort, hopeful to dc home     ROS: unable to illicit secondary to dementia    PMH:  Past Medical History  Diagnosis Date  . Hyperlipidemia   . Hypertension   . Arthritis   . Anxiety   . Asthma   . Leg pain     with walking  . Peripheral arterial disease   . Dementia  PSH: Past Surgical History  Procedure Laterality Date  . Back surgery     I have reviewed the FH and SH and  If appropriate update it with new information. Allergies  Allergen Reactions  . Celebrex [Celecoxib]     unknown  . Epinephrine     unsure  . Lidocaine     unsure  . Lyrica [Pregabalin]     Caused her to spit up excessively and became dehydrated  . Morphine And Related Other (See Comments)    hallucinations  . Sulfa Antibiotics     unsure  . Ultram [Tramadol Hcl] Other (See Comments)     Spit up and become dehydrated   Scheduled Meds: . amLODipine  5 mg Oral Daily  . aspirin EC  325 mg Oral Q breakfast  .  ceFAZolin (ANCEF) IV  1 g Intravenous Q6H  . Chlorhexidine Gluconate Cloth  6 each Topical Q0600  . cloNIDine  0.3 mg Oral BID  . hydrALAZINE  50 mg Oral QID  . levothyroxine  75 mcg Oral QAC breakfast  . losartan  100 mg Oral Daily  . mupirocin ointment   Nasal BID   Continuous Infusions:  PRN Meds:.acetaminophen, acetaminophen, ALPRAZolam, fluticasone, hydrALAZINE, HYDROcodone-acetaminophen, HYDROmorphone (DILAUDID) injection, menthol-cetylpyridinium, metoCLOPramide (REGLAN) injection, metoCLOPramide, morphine injection, ondansetron (ZOFRAN) IV, ondansetron, oxyCODONE, phenol, polyethylene glycol    BP 146/76  Pulse 82  Temp(Src) 98.6 F (37 C) (Oral)  Resp 16  Ht 5\' 4"  (1.626 m)  Wt 40.824 kg (90 lb)  BMI 15.44 kg/m2  SpO2 96%   PPS:30 % at best   Intake/Output Summary (Last 24 hours) at 07/10/13 1410 Last data filed at 07/10/13 0700  Gross per 24 hour  Intake   1330 ml  Output    200 ml  Net   1130 ml     Physical Exam:  General: elderly frail female, NAD HEENT:  Mm, no exudate Chest:   CTA CVS: RRR Abdomen:soft NT +BS Ext: without edema Neuro: pleasantly confused  Labs: CBC    Component Value Date/Time   WBC 5.7 07/10/2013 0615   RBC 3.20* 07/10/2013 0615   HGB 9.5* 07/10/2013 0615   HCT 28.6* 07/10/2013 0615   PLT 207 07/10/2013 0615   MCV 89.4 07/10/2013 0615   MCH 29.7 07/10/2013 0615   MCHC 33.2 07/10/2013 0615   RDW 14.6 07/10/2013 0615   LYMPHSABS 0.8 07/09/2013 1115   MONOABS 0.7 07/09/2013 1115   EOSABS 0.0 07/09/2013 1115   BASOSABS 0.0 07/09/2013 1115    BMET    Component Value Date/Time   NA 141 07/10/2013 0615   K 3.9 07/10/2013 0615   CL 103 07/10/2013 0615   CO2 26 07/10/2013 0615   GLUCOSE 143* 07/10/2013 0615   BUN 16 07/10/2013 0615   CREATININE 1.03 07/10/2013 0615   CALCIUM 8.4 07/10/2013  0615   GFRNONAA 46* 07/10/2013 0615   GFRAA 53* 07/10/2013 0615    CMP     Component Value Date/Time   NA 141 07/10/2013 0615   K 3.9 07/10/2013 0615   CL 103 07/10/2013 0615   CO2 26 07/10/2013 0615   GLUCOSE 143* 07/10/2013 0615   BUN 16 07/10/2013 0615   CREATININE 1.03 07/10/2013 0615   CALCIUM 8.4 07/10/2013 0615   PROT 5.5* 08/02/2008 1227   ALBUMIN 3.1* 08/02/2008 1227   AST 20 08/02/2008 1227   ALT 11 08/02/2008 1227   ALKPHOS 55 08/02/2008 1227   BILITOT 1.1 08/02/2008 1227   GFRNONAA  46* 07/10/2013 0615   GFRAA 53* 07/10/2013 0615      Time In Time Out Total Time Spent with Patient Total Overall Time  1115 1230 70 min 75 min    Greater than 50%  of this time was spent counseling and coordinating care related to the above assessment and plan.  Kendra Creed NP  Palliative Medicine Team Team Phone # [phone] Pager [phone]  Discussed with Wynetta Emery RN

## 2013-07-10 NOTE — Evaluation (Signed)
Physical Therapy Evaluation Patient Details Name: [name] MRN: [id number] DOB: Jan 23, 1919 Today's Date: 07/10/2013 Time: 1478-2956 PT Time Calculation (min): 31 min  PT Assessment / Plan / Recommendation History of Present Illness  INTRAMEDULLARY (IM) NAIL FEMORAL (Right)  Clinical Impression  This patient presents with acute pain and decreased functional independence following the above mentioned procedure. At the time of PT eval, pt required +2 total assist for bed mobility and transfer from bed to chair. RW was used, however nursing advised for SPT without walker when transferring pt back to bed. Unclear if pt was maintaining PWB status on R, however pt was cued to bear weight through her UE's while stepping. Son would like for pt to return to her home, as they have a 24 hour CNA available for care, with HHPT to follow.      PT Assessment  Patient needs continued PT services    Follow Up Recommendations  Home health PT    Does the patient have the potential to tolerate intense rehabilitation      Barriers to Discharge        Equipment Recommendations  None recommended by PT    Recommendations for Other Services     Frequency Min 5X/week    Precautions / Restrictions Precautions Precautions: Fall Restrictions Weight Bearing Restrictions: Yes RLE Weight Bearing: Partial weight bearing RLE Partial Weight Bearing Percentage or Pounds: 50%   Pertinent Vitals/Pain Pt does not rate pain on 0-10 scale, however asks to slow down during transfers due to pain.       Mobility  Bed Mobility Bed Mobility: Supine to Sit;Sitting - Scoot to Edge of Bed Supine to Sit: 1: +2 Total assist;HOB elevated Supine to Sit: Patient Percentage: 0% Sitting - Scoot to Edge of Bed: 1: +2 Total assist Sitting - Scoot to Edge of Bed: Patient Percentage: 0% Details for Bed Mobility Assistance: Bed pad used to assist with transition to EOB. RLE was supported and moved as pt was scooted around  to edge.  Transfers Transfers: Sit to Stand;Stand to Sit;Stand Pivot Transfers Sit to Stand: 3: Mod assist;Without upper extremity assist;From bed Stand to Sit: 1: +1 Total assist;Without upper extremity assist;To chair/3-in-1 Stand Pivot Transfers: 1: +2 Total assist Stand Pivot Transfers: Patient Percentage: 30% Details for Transfer Assistance: Attempted VCs for safe hand placement and sequencing but due to pt's dementia she was unable to follow.  Ambulation/Gait Ambulation/Gait Assistance: Not tested (comment) Ambulation/Gait Assistance Details: Unable at this time Stairs: No Wheelchair Mobility Wheelchair Mobility: No    Exercises     PT Diagnosis: Difficulty walking;Acute pain  PT Problem List: Decreased strength;Decreased range of motion;Decreased activity tolerance;Decreased balance;Decreased mobility;Decreased knowledge of use of DME;Decreased safety awareness;Decreased knowledge of precautions;Pain PT Treatment Interventions: DME instruction;Gait training;Stair training;Functional mobility training;Therapeutic activities;Therapeutic exercise;Neuromuscular re-education;Patient/family education     PT Goals(Current goals can be found in the care plan section) Acute Rehab PT Goals Patient Stated Goal: Pt unable to state a goal at this time PT Goal Formulation: With patient/family Time For Goal Achievement: 07/17/13 Potential to Achieve Goals: Fair  Visit Information  Last PT Received On: 07/10/13 Assistance Needed: +2 PT/OT/SLP Co-Evaluation/Treatment: Yes Reason for Co-Treatment: For patient/therapist safety PT goals addressed during session: Mobility/safety with mobility;Balance;Proper use of DME OT goals addressed during session: Proper use of Adaptive equipment and DME History of Present Illness: INTRAMEDULLARY (IM) NAIL FEMORAL (Right)       Prior Functioning  Home Living Family/patient expects to be discharged to:: Private residence  Living Arrangements: Other  (Comment) (Caregiver who is a CNA) Available Help at Discharge: Personal care attendant;Available 24 hours/day Type of Home: House Home Access: Stairs to enter [employer] of Steps: 2 (1 step>platform>1 step into home) Home Layout: One level Home Equipment: Walker - 2 wheels;Bedside commode Prior Function Level of Independence: Needs assistance Gait / Transfers Assistance Needed: S for all with RW ADL's / Homemaking Assistance Needed: Total A except she could feed herself Communication Communication: No difficulties Dominant Hand: Right    Cognition  Cognition Arousal/Alertness: Awake/alert Behavior During Therapy: WFL for tasks assessed/performed Overall Cognitive Status: History of cognitive impairments - at baseline    Extremity/Trunk Assessment Upper Extremity Assessment Upper Extremity Assessment: Defer to OT evaluation Lower Extremity Assessment Lower Extremity Assessment: Generalized weakness;RLE deficits/detail RLE Deficits / Details: Decreased strength and AROM consistent with femoral IM nailing RLE: Unable to fully assess due to pain Cervical / Trunk Assessment Cervical / Trunk Assessment: Kyphotic   Balance    End of Session PT - End of Session Equipment Utilized During Treatment: Gait belt Activity Tolerance: Patient limited by pain Patient left: in chair;with call bell/phone within reach;with family/visitor present Nurse Communication: Mobility status  GP     Ruthann Cancer 07/10/2013, 12:11 PM  Ruthann Cancer, PT, DPT [phone]

## 2013-07-10 NOTE — Evaluation (Signed)
Occupational Therapy Evaluation and Discharge Patient Details Name: [name] MRN: [id number] DOB: [birth year] Today's Date: 07/10/2013 Time: 4098-1191 OT Time Calculation (min): 32 min  OT Assessment / Plan / Recommendation History of present illness INTRAMEDULLARY (IM) NAIL FEMORAL (Right)   Clinical Impression   This 79 yo female admitted and underwent above presents to acute OT with no acute or post acute care OT needs due to pta pt was assisted with all BADLs due to dementia and will need even more A now. Son is aware and says pt will have the A she needs at the home where she lives. Acute OT will sign off.    OT Assessment  Patient does not need any further OT services    Follow Up Recommendations  No OT follow up       Equipment Recommendations  None recommended by OT          Precautions / Restrictions Precautions Precautions: Fall Restrictions Weight Bearing Restrictions: Yes RLE Weight Bearing: Partial weight bearing RLE Partial Weight Bearing Percentage or Pounds: 50%   Pertinent Vitals/Pain PAINAD=4; repositioned    ADL  Equipment Used: Gait belt;Rolling walker Transfers/Ambulation Related to ADLs: Mod A sit>stand; total A stand>sit (did not attempt to help); total A +2 (pt =30%) stand turn from bed to recliner to her left ADL Comments: Pt was pretty much total A for all BADLs pta except eating and varied with A needed for toileting--will have 24/7 care at home with increased A needed        Visit Information  Last OT Received On: 07/10/13 Assistance Needed: +2 PT/OT/SLP Co-Evaluation/Treatment: Yes Reason for Co-Treatment: For patient/therapist safety OT goals addressed during session: Proper use of Adaptive equipment and DME History of Present Illness: INTRAMEDULLARY (IM) NAIL FEMORAL (Right)       Prior Functioning     Home Living Family/patient expects to be discharged to:: Private residence Living Arrangements: Other (Comment) (cargiver  who is a Lawyer) Available Help at Discharge: Personal care attendant;Available 24 hours/day Type of Home: House Home Access: Stairs to enter [employer] of Steps: 2 Home Layout: One level Home Equipment: Walker - 2 wheels;Bedside commode Prior Function Level of Independence: Needs assistance Gait / Transfers Assistance Needed: S for all with RW ADL's / Homemaking Assistance Needed: Total A except she could feed herself Communication Communication: No difficulties Dominant Hand: Right         Vision/Perception Vision - History Patient Visual Report: No change from baseline   Cognition  Cognition Arousal/Alertness: Awake/alert Behavior During Therapy: WFL for tasks assessed/performed Overall Cognitive Status: History of cognitive impairments - at baseline    Extremity/Trunk Assessment Upper Extremity Assessment Upper Extremity Assessment: Overall WFL for tasks assessed     Mobility Bed Mobility Bed Mobility: Supine to Sit;Sitting - Scoot to Edge of Bed Supine to Sit: 1: +2 Total assist;HOB elevated Supine to Sit: Patient Percentage: 0% Sitting - Scoot to Edge of Bed: 1: +2 Total assist Sitting - Scoot to Edge of Bed: Patient Percentage: 0% Transfers Transfers: Sit to Stand;Stand to Sit Sit to Stand: 3: Mod assist;Without upper extremity assist;From bed Stand to Sit: 1: +1 Total assist;Without upper extremity assist;To chair/3-in-1 Details for Transfer Assistance: Attempted VCs for safe hand placement but due to pt's dementia she was unable to follow           End of Session OT - End of Session Equipment Utilized During Treatment: Gait belt;Rolling walker Activity Tolerance: Patient limited by fatigue (limited  by cognition) Patient left: in chair;with call bell/phone within reach;with family/visitor present Nurse Communication: Mobility status (+2 stand pivot/turn)       [name] Georges [phone] 07/10/2013, 11:36 AM

## 2013-07-10 NOTE — Op Note (Signed)
NAMEMONITA, SWIER NO.:  [id number]  MEDICAL RECORD NO.:  [id number]  LOCATION:  5N05C                        FACILITY:  [facility]  PHYSICIAN:  Jene Every, M.D.    DATE OF BIRTH:  1919-01-20  DATE OF PROCEDURE:  07/09/2013 DATE OF DISCHARGE:                              OPERATIVE REPORT   PREOPERATIVE DIAGNOSIS:  Right pertrochanteric hip fracture.  POSTOPERATIVE DIAGNOSIS:  Right pertrochanteric hip fracture.  PROCEDURE PERFORMED:  Open reduction and internal fixation with intramedullary rodding of the right proximal femur.  ANESTHESIA:  General.  ASSISTANT:  Lanna Poche, PA.  HISTORY:  A 79 year old with pertrochanteric fracture with displacement indicated for stabilization by IM nailing.  Risk and benefits discussed including bleeding, infection, damage to neurovascular structures, DV,T PE, anesthetic complications, etc.  TECHNIQUE:  With the patient in supine position, after induction of adequate anesthesia 1 g Kefzol, placed on the fracture table.  All bony prominences were well padded.  Foley to gravity.  Peroneal post well padded.  Right lower extremity in gentle longitudinal traction, neutral version, under x-ray was anatomic reduction.  The right hip and leg was prepped and draped in usual sterile fashion.  Incision made proximal to the greater trochanter, divided the fascia. The guide pin, placed in the tip of the trochanter and placed into the intramedullary canal, verified the AP and lateral plane, reamed proximally, placed a 9 mm short intramedullary Biomet rod 135 degree without difficulty.  With the external alignment guide, incision laterally.  We placed a guide pin up in the femoral head and neck, measured to an 80, reamed.  We placed the 80 mm lag screw.  Excellent purchase.  Locked it proximally.  Prior to the compressed at the fracture site, we released some of the traction.  Next, we fixed the distal portion of the rod,  made a small incision distally with the external alignment guide and a guide.  We drilled the cortex in the static lock, measured it to a 34, and inserted the bicortical screw with excellent purchase in the AP and lateral plane, anatomic reduction of the fracture.  Copiously irrigated the wound, closed the fascia with 0 Vicryl, subcu with 2-0 Vicryl, skin staples, wound was dressed sterilely. Removed from the hospital bed in the appropriate rotation.  Good leg lengths, good pulses, transported to the recovery in satisfactory condition.  The patient tolerated the procedure well.  No complications.  No blood loss.     Jene Every, M.D.     Cordelia Pen  D:  07/09/2013  T:  07/10/2013  Job:  [id number]

## 2013-07-10 NOTE — Progress Notes (Signed)
[facility] PROGRESS NOTE  JAIDEE STIPE [name]:[id number] DOB: Dec 15, 1918 DOA: 07/09/2013 PCP: Bufford Spikes, DO  Assessment/Plan: Acute intertrochanteric right hip fracture  Patient status post ORIF with IM to right femur. PT/OT. Pain management. Per orthopedics. Uncontrolled hypertension  likely secondary to pain. Some improvement with pain control. Continue her home medications of amlodipine, clonidine, hydralazine, losartan.  Hypothyroidism  Continue levothyroxine  Anxiety  Continue Xanax  Asthma  Currently on no home medications, will continue to monitor. Peripheral arterial disease  Currently stable.  Alzheimer's dementia  Currently on no medications. Will continue to monitor.   Code Status: DO NOT RESUSCITATE Family Communication: updated son at bedside. Disposition Plan: home with home health   Consultants:  Orthopedics: Dr. Shelle Iron 07/09/2013  Palliative care  Procedures:  ORIF with IM of right proximal femur per Dr. Shelle Iron 07/09/2013  Antibiotics:  none  HPI/Subjective: Patient with some complaints of pain. Patient pleasantly confused.  Objective: Filed Vitals:   07/10/13 1550  BP: 168/53  Pulse: 84  Temp: 99 F (37.2 C)  Resp: 19    Intake/Output Summary (Last 24 hours) at 07/10/13 1643 Last data filed at 07/10/13 1500  Gross per 24 hour  Intake   1370 ml  Output    575 ml  Net    795 ml   Filed Weights   07/09/13 1004  Weight: 40.824 kg (90 lb)    Exam:   General:  NAD. Pleasantly confused.  Cardiovascular: regular rate rhythm no murmurs rubs or gallops.  Respiratory: clear to auscultation bilaterally.  Abdomen: soft, nontender, nondistended, positive bowel sounds  Musculoskeletal: no clubbing cyanosis or edema.  Data Reviewed: Basic Metabolic Panel:  Recent Labs Lab 07/09/13 1115 07/10/13 0615  NA 138 141  K 3.2* 3.9  CL 99 103  CO2 28 26  GLUCOSE 137* 143*  BUN 15 16  CREATININE 1.03 1.03  CALCIUM 8.7 8.4    Liver Function Tests: No results found for this basename: AST, ALT, ALKPHOS, BILITOT, PROT, ALBUMIN,  in the last 168 hours No results found for this basename: LIPASE, AMYLASE,  in the last 168 hours No results found for this basename: AMMONIA,  in the last 168 hours CBC:  Recent Labs Lab 07/09/13 1115 07/10/13 0615  WBC 7.6 5.7  NEUTROABS 6.1  --   HGB 10.7* 9.5*  HCT 32.5* 28.6*  MCV 90.0 89.4  PLT 216 207   Cardiac Enzymes: No results found for this basename: CKTOTAL, CKMB, CKMBINDEX, TROPONINI,  in the last 168 hours BNP (last 3 results) No results found for this basename: PROBNP,  in the last 8760 hours CBG: No results found for this basename: GLUCAP,  in the last 168 hours  Recent Results (from the past 240 hour(s))  SURGICAL PCR SCREEN     Status: Abnormal   Collection Time    07/09/13  4:04 PM      Result Value Range Status   MRSA, PCR POSITIVE (*) NEGATIVE Final   Staphylococcus aureus POSITIVE (*) NEGATIVE Final   Comment:            The Xpert SA Assay (FDA     approved for NASAL specimens     in patients over 72 years of age),     is one component of     a comprehensive surveillance     program.  Test performance has     been validated by [employer] for patients greater     than  or equal to 61 year old.     It is not intended     to diagnose infection nor to     guide or monitor treatment.     Studies: Dg Hip Complete Right  07/09/2013   CLINICAL DATA:  Right hip fracture.  EXAM: RIGHT HIP - COMPLETE 2+ VIEW  COMPARISON:  None.  FINDINGS: Acute intertrochanteric fracture of the proximal right femur present with displacement, angulation and impaction. No dislocation identified. No other pelvic fractures are seen.  IMPRESSION: Acute intertrochanteric right hip fracture.   Electronically Signed   By: Irish Lack M.D.   On: 07/09/2013 13:00   Dg Hip Operative Right  07/09/2013   CLINICAL DATA:  ORIF of the right hip.  EXAM: DG OPERATIVE RIGHT  HIP  TECHNIQUE: A single spot fluoroscopic AP image of the right hip is submitted.  COMPARISON:  None.  FINDINGS: Eight fluoroscopic spot films demonstrate gamma nail placement with short intra medullary nail. The IM nail has a distal interlocking screw.  IMPRESSION: Gamma nail fixation of the right intertrochanteric femur fracture.   Electronically Signed   By: Andreas Newport M.D.   On: 07/09/2013 20:24   Dg Pelvis Portable  07/09/2013   CLINICAL DATA:  Postop ORIF of right hip fracture  EXAM: PORTABLE PELVIS 1-2 VIEWS  COMPARISON:  AP pelvis radiographs-earlier same day  FINDINGS: Provided image demonstrates the sequela of intra medullary rod fixation of the proximal femur and dynamic screw fixation of the right femoral neck. Note, the caudal end of the right femoral intra medullary rod is excluded from view.  There is a persistently displaced avulsion fracture of the right lesser trochanter. Otherwise, there is near anatomic alignment given obliquity.  There are expected foci of subcutaneous emphysema about the operative site. Adjacent skin staples. Vascular calcifications.  IMPRESSION: Post ORIF of the right hip and femur without definite evidence of complication.   Electronically Signed   By: Simonne Come M.D.   On: 07/09/2013 21:31   Dg Pelvis Portable  07/09/2013   CLINICAL DATA:  Fall. Obvious right hip deformity at physical examination.  EXAM: PORTABLE PELVIS 1-2 VIEWS  COMPARISON:  None.  FINDINGS: Comminuted intertrochanteric right femoral neck fracture. Right hip joint anatomically aligned with marked joint space narrowing. Remainder of the pelvis not optimally imaged.  IMPRESSION: Comminuted intertrochanteric right femoral neck fracture.   Electronically Signed   By: Hulan Saas M.D.   On: 07/09/2013 10:30   Dg Chest Portable 1 View  07/09/2013   CLINICAL DATA:  Fall  EXAM: PORTABLE CHEST - 1 VIEW  COMPARISON:  05/20/2012  FINDINGS: Cardiomediastinal silhouette is stable.  Hyperinflation. Mild degenerative changes thoracic spine. Atherosclerotic calcifications of thoracic aorta. No acute infiltrate or pulmonary edema.  IMPRESSION: No active disease.  Hyperinflation.   Electronically Signed   By: Natasha Mead M.D.   On: 07/09/2013 11:20    Scheduled Meds: . amLODipine  5 mg Oral Daily  . aspirin EC  325 mg Oral Q breakfast  . Chlorhexidine Gluconate Cloth  6 each Topical Q0600  . cloNIDine  0.3 mg Oral BID  . hydrALAZINE  50 mg Oral QID  . levothyroxine  75 mcg Oral QAC breakfast  . losartan  100 mg Oral Daily  . mupirocin ointment   Nasal BID   Continuous Infusions:   Principal Problem:   Hip fracture, right Active Problems:   PAD (peripheral artery disease)   Hyperlipidemia   Hypertension   Alzheimer's dementia  Chronic pain syndrome   Asthma   Anxiety   Closed right hip fracture   Protein-calorie malnutrition, severe   Palliative care encounter   Weakness generalized    Time spent: 30 minutes    THOMPSON,DANIEL M.D. [facility] Pager [phone]. If 7PM-7AM, please contact night-coverage at [email or web address], password [facility] 07/10/2013, 4:43 PM  LOS: 1 day

## 2013-07-11 LAB — CBC
HCT: 24.6 % — ABNORMAL LOW (ref 36.0–46.0)
Hemoglobin: 8.2 g/dL — ABNORMAL LOW (ref 12.0–15.0)
MCH: 30 pg (ref 26.0–34.0)
MCH: 30.4 pg (ref 26.0–34.0)
MCHC: 33.3 g/dL (ref 30.0–36.0)
MCHC: 33.8 g/dL (ref 30.0–36.0)
Platelets: 136 10*3/uL — ABNORMAL LOW (ref 150–400)
RBC: 2.63 MIL/uL — ABNORMAL LOW (ref 3.87–5.11)
RDW: 14.8 % (ref 11.5–15.5)
WBC: 7.2 10*3/uL (ref 4.0–10.5)

## 2013-07-11 LAB — BASIC METABOLIC PANEL
BUN: 21 mg/dL (ref 6–23)
Calcium: 7.9 mg/dL — ABNORMAL LOW (ref 8.4–10.5)
GFR calc Af Amer: 48 mL/min — ABNORMAL LOW (ref >90)
GFR calc non Af Amer: 41 mL/min — ABNORMAL LOW (ref >90)
Glucose, Bld: 113 mg/dL — ABNORMAL HIGH (ref 70–99)
Sodium: 136 mEq/L (ref 135–145)

## 2013-07-11 NOTE — Progress Notes (Signed)
Speech Language Pathology Treatment: Dysphagia  Patient Details Name: [name] MRN: [id number] DOB: 10/24/1918 Today's Date: 07/11/2013 Time: 4098-1191 SLP Time Calculation (min): 10 min  Assessment / Plan / Recommendation Clinical Impression  F/u after yesterday's swallow eval.  Pt has been seen by Palliative medicine; may potentially be D/Cd home with son tomorrow.  Awakened out of deep sleep to participate in dysphagia management - appeared more confused than yesterday and coughed intermittently after consuming nectar liquids.  RN states her report from previous RN was that pt tolerated breakfast and lunch well today.    Recommend feeding meals cautiously - slow rate, monitor bolus size and hold liquids if coughing frequently. Pt is high aspiration risk given dementia, advanced age, recent surgery.  However, comfort is main focus of care per Palliative Care note.  Will follow.   HPI HPI: 79 y.o. female with a history of hypertension, hyperlipidemia, dementia, peripheral arterial disease presented to emergency department with her son 12/21 after falling and sustaining a right hip fx.  Underwent INTRAMEDULLARY (IM) NAIL FEMORAL on 12/22. Post surgery, noted to exhibit coughing in conjunction with PO intake, hence SLP swallow eval ordered.          SLP Plan  Continue with current plan of care    Recommendations Diet recommendations: Dysphagia 3 (mechanical soft);Nectar-thick liquid Liquids provided via: Cup Medication Administration: Whole meds with puree Supervision: Full supervision/cueing for compensatory strategies Compensations: Slow rate;Small sips/bites Postural Changes and/or Swallow Maneuvers: Seated upright 90 degrees;Upright 30-60 min after meal              Oral Care Recommendations: Oral care BID Follow up Recommendations: None Plan: Continue with current plan of care    [name], [place] CCC/SLP Pager [phone]      [name] 07/11/2013,  4:38 PM

## 2013-07-11 NOTE — Progress Notes (Signed)
Subjective: 2 Days Post-Op Procedure(s) (LRB): INTRAMEDULLARY (IM) NAIL FEMORAL (Right) Patient reports pain as 3 on 0-10 scale.    Objective: Vital signs in last 24 hours: Temp:  [97.9 F (36.6 C)-99 F (37.2 C)] 97.9 F (36.6 C) (12/24 0623) Pulse Rate:  [67-84] 67 (12/24 1053) Resp:  [16-19] 16 (12/24 1240) BP: (150-182)/(53-62) 182/62 mmHg (12/24 1053) SpO2:  [65 %-99 %] 95 % (12/24 1240)  Intake/Output from previous day: 12/23 0701 - 12/24 0700 In: 380 [P.O.:380] Out: 375 [Urine:375] Intake/Output this shift:     Recent Labs  07/09/13 1115 07/10/13 0615 07/11/13 0458  HGB 10.7* 9.5* 8.2*    Recent Labs  07/10/13 0615 07/11/13 0458  WBC 5.7 7.2  RBC 3.20* 2.73*  HCT 28.6* 24.6*  PLT 207 151    Recent Labs  07/10/13 0615 07/11/13 0458  NA 141 136  K 3.9 3.7  CL 103 101  CO2 26 26  BUN 16 21  CREATININE 1.03 1.12*  GLUCOSE 143* 113*  CALCIUM 8.4 7.9*   No results found for this basename: LABPT, INR,  in the last 72 hours  Neurologically intact Incision: dressing C/D/I and no drainage  Assessment/Plan: 2 Days Post-Op Procedure(s) (LRB): INTRAMEDULLARY (IM) NAIL FEMORAL (Right) Advance diet Up with therapy  Yariel Ferraris C 07/11/2013, 1:35 PM

## 2013-07-11 NOTE — Progress Notes (Signed)
Physical Therapy Treatment Patient Details Name: [name] MRN: [id number] DOB: 07/08/1919 Today's Date: 07/11/2013 Time: 4696-2952 PT Time Calculation (min): 24 min  PT Assessment / Plan / Recommendation  History of Present Illness Pt is a 79 y/o female admitted s/p IM nailing of R femoral fracture. Pt is now PWB 50%.   PT Comments   Pt making slow progress towards physical therapy goals. Due to dementia, unable to follow commands for therapeutic exercise, however was able to transfer to the chair utilizing a SPT +2 with greater ease compared to attempting with the RW. All therapeutic exercise was PROM with occasional AAROM, as pt did not want to move the RLE due to pain.   Follow Up Recommendations  Home health PT     Does the patient have the potential to tolerate intense rehabilitation     Barriers to Discharge        Equipment Recommendations  None recommended by PT    Recommendations for Other Services    Frequency Min 5X/week   Progress towards PT Goals Progress towards PT goals: Progressing toward goals  Plan Current plan remains appropriate    Precautions / Restrictions Precautions Precautions: Fall Restrictions Weight Bearing Restrictions: Yes RLE Weight Bearing: Partial weight bearing RLE Partial Weight Bearing Percentage or Pounds: 50%   Pertinent Vitals/Pain Pt unable to rate pain on 0-10 scale during session. Checked with nursing, pt had pain medication prior to PT session.     Mobility  Bed Mobility Bed Mobility: Supine to Sit;Sitting - Scoot to Edge of Bed Supine to Sit: 1: +2 Total assist;HOB elevated Supine to Sit: Patient Percentage: 0% Sitting - Scoot to Edge of Bed: 1: +2 Total assist Sitting - Scoot to Edge of Bed: Patient Percentage: 0% Details for Bed Mobility Assistance: Bed pad used to assist with transition to EOB. RLE was supported and moved as pt was scooted around to edge.  Transfers Transfers: Sit to Stand;Stand to Sit;Stand Pivot  Transfers Sit to Stand: 3: Mod assist;Without upper extremity assist;From bed Stand to Sit: 1: +1 Total assist;Without upper extremity assist;To chair/3-in-1 Stand Pivot Transfers: 1: +2 Total assist Stand Pivot Transfers: Patient Percentage: 30% Details for Transfer Assistance: Attempted VCs for safe hand placement and sequencing but due to pt's dementia she was unable to follow.  Did not use the walker and transferred with +2 assist rather than pivoting with the RW. Pt able to follow commands better without distraction of the walker. Ambulation/Gait Ambulation/Gait Assistance: Not tested (comment) Ambulation/Gait Assistance Details: Unable at this time Stairs: No Wheelchair Mobility Wheelchair Mobility: No    Exercises General Exercises - Lower Extremity Ankle Circles/Pumps: 10 reps;PROM;AAROM Heel Slides: 10 reps;PROM;AAROM Hip ABduction/ADduction: 10 reps;PROM;AAROM   PT Diagnosis:    PT Problem List:   PT Treatment Interventions:     PT Goals (current goals can now be found in the care plan section) Acute Rehab PT Goals Patient Stated Goal: Pt unable to state a goal at this time PT Goal Formulation: With patient/family Time For Goal Achievement: 07/17/13 Potential to Achieve Goals: Fair  Visit Information  Last PT Received On: 07/11/13 Assistance Needed: +2 History of Present Illness: Pt is a 79 y/o female admitted s/p IM nailing of R femoral fracture. Pt is now PWB 50%.    Subjective Data  Subjective: "My leg hurts." Patient Stated Goal: Pt unable to state a goal at this time   Cognition  Cognition Arousal/Alertness: Awake/alert Behavior During Therapy: [facility] for tasks assessed/performed Overall  Cognitive Status: History of cognitive impairments - at baseline    Balance  Balance Balance Assessed: Yes Static Sitting Balance Static Sitting - Balance Support: Feet supported;Bilateral upper extremity supported Static Sitting - Level of Assistance: 4: Min assist;3: Mod  assist Static Sitting - Comment/# of Minutes: At times required mod assist for trunk support.  Static Standing Balance Static Standing - Balance Support: Bilateral upper extremity supported Static Standing - Level of Assistance: 3: Mod assist Static Standing - Comment/# of Minutes: Standing at EOB prior to transferring to chair  End of Session PT - End of Session Equipment Utilized During Treatment: Gait belt Activity Tolerance: Patient limited by pain Patient left: in chair;with call bell/phone within reach Nurse Communication: Mobility status   GP     Ruthann Cancer 07/11/2013, 10:56 AM  Ruthann Cancer, PT, DPT [phone]

## 2013-07-11 NOTE — Progress Notes (Signed)
[facility] PROGRESS NOTE  CRISOL MUECKE ZOX:[id number] DOB: 1919/02/27 DOA: 07/09/2013 PCP: Bufford Spikes, DO  Assessment/Plan: Acute intertrochanteric right hip fracture  Patient status post ORIF with IM to right femur. PT/OT. Pain management. Per orthopedics. Uncontrolled hypertension  likely secondary to pain. Some improvement with pain control. Continue her home medications of amlodipine, clonidine, hydralazine, losartan.  Hypothyroidism  Continue levothyroxine  Anxiety  Continue Xanax  Asthma  Currently on no home medications, will continue to monitor. Peripheral arterial disease  Currently stable.  Alzheimer's dementia  Currently on no medications. Will continue to monitor.   Code Status: DO NOT RESUSCITATE Family Communication: updated son at bedside. Disposition Plan: home with home health   Consultants:  Orthopedics: Dr. Shelle Iron 07/09/2013  Palliative care  Procedures:  ORIF with IM of right proximal femur per Dr. Shelle Iron 07/09/2013  Antibiotics:  none  HPI/Subjective: Patient sleeping.  Objective: Filed Vitals:   07/11/13 0833  BP:   Pulse:   Temp:   Resp: 18    Intake/Output Summary (Last 24 hours) at 07/11/13 1006 Last data filed at 07/10/13 1900  Gross per 24 hour  Intake    380 ml  Output    375 ml  Net      5 ml   Filed Weights   07/09/13 1004  Weight: 40.824 kg (90 lb)    Exam:   General:  Sleeping.  Cardiovascular: regular rate rhythm no murmurs rubs or gallops.  Respiratory: clear to auscultation bilaterally.  Abdomen: soft, nontender, nondistended, positive bowel sounds  Musculoskeletal: no clubbing cyanosis or edema.  Data Reviewed: Basic Metabolic Panel:  Recent Labs Lab 07/09/13 1115 07/10/13 0615 07/11/13 0458  NA 138 141 136  K 3.2* 3.9 3.7  CL 99 103 101  CO2 28 26 26   GLUCOSE 137* 143* 113*  BUN 15 16 21   CREATININE 1.03 1.03 1.12*  CALCIUM 8.7 8.4 7.9*   Liver Function Tests: No results  found for this basename: AST, ALT, ALKPHOS, BILITOT, PROT, ALBUMIN,  in the last 168 hours No results found for this basename: LIPASE, AMYLASE,  in the last 168 hours No results found for this basename: AMMONIA,  in the last 168 hours CBC:  Recent Labs Lab 07/09/13 1115 07/10/13 0615 07/11/13 0458  WBC 7.6 5.7 7.2  NEUTROABS 6.1  --   --   HGB 10.7* 9.5* 8.2*  HCT 32.5* 28.6* 24.6*  MCV 90.0 89.4 90.1  PLT 216 207 151   Cardiac Enzymes: No results found for this basename: CKTOTAL, CKMB, CKMBINDEX, TROPONINI,  in the last 168 hours BNP (last 3 results) No results found for this basename: PROBNP,  in the last 8760 hours CBG: No results found for this basename: GLUCAP,  in the last 168 hours  Recent Results (from the past 240 hour(s))  SURGICAL PCR SCREEN     Status: Abnormal   Collection Time    07/09/13  4:04 PM      Result Value Range Status   MRSA, PCR POSITIVE (*) NEGATIVE Final   Staphylococcus aureus POSITIVE (*) NEGATIVE Final   Comment:            The Xpert SA Assay (FDA     approved for NASAL specimens     in patients over 34 years of age),     is one component of     a comprehensive surveillance     program.  Test performance has     been validated by [employer]  [facility] for patients greater     than or equal to 68 year old.     It is not intended     to diagnose infection nor to     guide or monitor treatment.     Studies: Dg Hip Complete Right  07/09/2013   CLINICAL DATA:  Right hip fracture.  EXAM: RIGHT HIP - COMPLETE 2+ VIEW  COMPARISON:  None.  FINDINGS: Acute intertrochanteric fracture of the proximal right femur present with displacement, angulation and impaction. No dislocation identified. No other pelvic fractures are seen.  IMPRESSION: Acute intertrochanteric right hip fracture.   Electronically Signed   By: Irish Lack M.D.   On: 07/09/2013 13:00   Dg Hip Operative Right  07/09/2013   CLINICAL DATA:  ORIF of the right hip.  EXAM: DG OPERATIVE  RIGHT HIP  TECHNIQUE: A single spot fluoroscopic AP image of the right hip is submitted.  COMPARISON:  None.  FINDINGS: Eight fluoroscopic spot films demonstrate gamma nail placement with short intra medullary nail. The IM nail has a distal interlocking screw.  IMPRESSION: Gamma nail fixation of the right intertrochanteric femur fracture.   Electronically Signed   By: Andreas Newport M.D.   On: 07/09/2013 20:24   Dg Pelvis Portable  07/09/2013   CLINICAL DATA:  Postop ORIF of right hip fracture  EXAM: PORTABLE PELVIS 1-2 VIEWS  COMPARISON:  AP pelvis radiographs-earlier same day  FINDINGS: Provided image demonstrates the sequela of intra medullary rod fixation of the proximal femur and dynamic screw fixation of the right femoral neck. Note, the caudal end of the right femoral intra medullary rod is excluded from view.  There is a persistently displaced avulsion fracture of the right lesser trochanter. Otherwise, there is near anatomic alignment given obliquity.  There are expected foci of subcutaneous emphysema about the operative site. Adjacent skin staples. Vascular calcifications.  IMPRESSION: Post ORIF of the right hip and femur without definite evidence of complication.   Electronically Signed   By: Simonne Come M.D.   On: 07/09/2013 21:31   Dg Pelvis Portable  07/09/2013   CLINICAL DATA:  Fall. Obvious right hip deformity at physical examination.  EXAM: PORTABLE PELVIS 1-2 VIEWS  COMPARISON:  None.  FINDINGS: Comminuted intertrochanteric right femoral neck fracture. Right hip joint anatomically aligned with marked joint space narrowing. Remainder of the pelvis not optimally imaged.  IMPRESSION: Comminuted intertrochanteric right femoral neck fracture.   Electronically Signed   By: Hulan Saas M.D.   On: 07/09/2013 10:30   Dg Chest Portable 1 View  07/09/2013   CLINICAL DATA:  Fall  EXAM: PORTABLE CHEST - 1 VIEW  COMPARISON:  05/20/2012  FINDINGS: Cardiomediastinal silhouette is stable.  Hyperinflation. Mild degenerative changes thoracic spine. Atherosclerotic calcifications of thoracic aorta. No acute infiltrate or pulmonary edema.  IMPRESSION: No active disease.  Hyperinflation.   Electronically Signed   By: Natasha Mead M.D.   On: 07/09/2013 11:20    Scheduled Meds: . amLODipine  5 mg Oral Daily  . aspirin EC  325 mg Oral Q breakfast  . Chlorhexidine Gluconate Cloth  6 each Topical Q0600  . cloNIDine  0.3 mg Oral BID  . hydrALAZINE  50 mg Oral QID  . levothyroxine  75 mcg Oral QAC breakfast  . losartan  100 mg Oral Daily  . mupirocin ointment   Nasal BID   Continuous Infusions:   Principal Problem:   Hip fracture, right Active Problems:   PAD (peripheral artery disease)  Hyperlipidemia   Hypertension   Alzheimer's dementia   Chronic pain syndrome   Asthma   Anxiety   Closed right hip fracture   Protein-calorie malnutrition, severe   Palliative care encounter   Weakness generalized    Time spent: 30 minutes    Janyia Guion M.D. [facility] Pager [phone]. If 7PM-7AM, please contact night-coverage at [email or web address], password [facility] 07/11/2013, 10:06 AM  LOS: 2 days

## 2013-07-12 ENCOUNTER — Inpatient Hospital Stay (HOSPITAL_COMMUNITY): Payer: Medicare Other

## 2013-07-12 DIAGNOSIS — D649 Anemia, unspecified: Secondary | ICD-10-CM | POA: Clinically undetermined

## 2013-07-12 DIAGNOSIS — J189 Pneumonia, unspecified organism: Secondary | ICD-10-CM | POA: Diagnosis not present

## 2013-07-12 DIAGNOSIS — N179 Acute kidney failure, unspecified: Secondary | ICD-10-CM

## 2013-07-12 LAB — URINALYSIS, ROUTINE W REFLEX MICROSCOPIC
Bilirubin Urine: NEGATIVE
Glucose, UA: NEGATIVE mg/dL
Ketones, ur: NEGATIVE mg/dL
Leukocytes, UA: NEGATIVE
Nitrite: NEGATIVE
Protein, ur: 30 mg/dL — AB
Urobilinogen, UA: 0.2 mg/dL (ref 0.0–1.0)

## 2013-07-12 LAB — PREPARE RBC (CROSSMATCH)

## 2013-07-12 LAB — BASIC METABOLIC PANEL
Chloride: 100 mEq/L (ref 96–112)
GFR calc Af Amer: 34 mL/min — ABNORMAL LOW (ref >90)
Glucose, Bld: 130 mg/dL — ABNORMAL HIGH (ref 70–99)
Potassium: 4.1 mEq/L (ref 3.5–5.1)

## 2013-07-12 LAB — URINE MICROSCOPIC-ADD ON

## 2013-07-12 LAB — CBC
Platelets: 140 10*3/uL — ABNORMAL LOW (ref 150–400)
RBC: 2.53 MIL/uL — ABNORMAL LOW (ref 3.87–5.11)
RDW: 14.6 % (ref 11.5–15.5)
WBC: 11.6 10*3/uL — ABNORMAL HIGH (ref 4.0–10.5)

## 2013-07-12 LAB — FOLATE: Folate: 19.8 ng/mL

## 2013-07-12 LAB — IRON AND TIBC
Iron: <10 ug/dL — ABNORMAL LOW (ref 42–135)
UIBC: 171 ug/dL (ref 125–400)

## 2013-07-12 LAB — FERRITIN: Ferritin: 255 ng/mL (ref 10–291)

## 2013-07-12 LAB — ABO/RH: ABO/RH(D): A POS

## 2013-07-12 LAB — SODIUM, URINE, RANDOM: Sodium, Ur: 17 mEq/L

## 2013-07-12 LAB — CREATININE, URINE, RANDOM: Creatinine, Urine: 118.53 mg/dL

## 2013-07-12 MED ORDER — DIPHENHYDRAMINE HCL 12.5 MG/5ML PO ELIX: 12.5000 mg | ORAL_SOLUTION | Freq: Once | ORAL | Status: DC

## 2013-07-12 MED ORDER — DIPHENHYDRAMINE HCL 50 MG/ML IJ SOLN
12.5000 mg | Freq: Once | INTRAMUSCULAR | Status: AC
  Administered 2013-07-12: 12.5 mg via INTRAVENOUS
  Filled 2013-07-12: qty 1

## 2013-07-12 MED ORDER — SODIUM CHLORIDE 0.9 % IV BOLUS (SEPSIS)
500.0000 mL | Freq: Once | INTRAVENOUS | Status: AC
  Administered 2013-07-12: 500 mL via INTRAVENOUS

## 2013-07-12 MED ORDER — ACETAMINOPHEN 325 MG PO TABS
650.0000 mg | ORAL_TABLET | Freq: Once | ORAL | Status: AC
  Administered 2013-07-12: 650 mg via ORAL
  Filled 2013-07-12: qty 2

## 2013-07-12 MED ORDER — SODIUM CHLORIDE 0.9 % IV SOLN
INTRAVENOUS | Status: DC
  Administered 2013-07-12: 09:00:00 via INTRAVENOUS

## 2013-07-12 MED ORDER — PIPERACILLIN-TAZOBACTAM IN DEX 2-0.25 GM/50ML IV SOLN
2.2500 g | Freq: Four times a day (QID) | INTRAVENOUS | Status: DC
  Administered 2013-07-12 – 2013-07-13 (×3): 2.25 g via INTRAVENOUS
  Filled 2013-07-12 (×5): qty 50

## 2013-07-12 MED ORDER — VANCOMYCIN HCL IN DEXTROSE 750-5 MG/150ML-% IV SOLN
750.0000 mg | Freq: Once | INTRAVENOUS | Status: AC
  Administered 2013-07-12: 750 mg via INTRAVENOUS
  Filled 2013-07-12: qty 150

## 2013-07-12 NOTE — Progress Notes (Signed)
Low hgb postop with anticoagulation. Pt demented and unable to fully assess Recommend transfusion 2 units PRBC

## 2013-07-12 NOTE — Progress Notes (Signed)
[facility] PROGRESS NOTE  [name] ZOX:[id number] DOB: [birth year] DOA: 07/09/2013 PCP: Bufford Spikes, DO  Assessment/Plan: Acute intertrochanteric right hip fracture  Patient status post ORIF with IM to right femur. PT/OT. Pain management. Per orthopedics. ARF Likely secondary to prerenal azotemia. Check a urine sodium. Check urine creatinine. IV fluids. Follow. Anemia/ABLA Likely acute blood loss anemia secondary to surgery. Will check an anemia panel. Blood transfusion has been ordered per orthopedics. Follow. Uncontrolled hypertension  likely secondary to pain. Some improvement with pain control. Continue her home medications of amlodipine, clonidine, hydralazine, losartan.  Hypothyroidism  Continue levothyroxine  Anxiety  Continue Xanax  Asthma  Currently on no home medications, will continue to monitor. Peripheral arterial disease  Currently stable.  Alzheimer's dementia  Currently on no medications. Will continue to monitor.   Code Status: DO NOT RESUSCITATE Family Communication: updated son at bedside. Disposition Plan: home with home health   Consultants:  Orthopedics: Dr. Shelle Iron 07/09/2013  Palliative care  Procedures:  ORIF with IM of right proximal femur per Dr. Shelle Iron 07/09/2013  2 units PRBCs pending  Antibiotics:  none  HPI/Subjective: Patient alert. Patient with cough.   Objective: Filed Vitals:   07/12/13 0536  BP: 174/59  Pulse: 70  Temp: 97.6 F (36.4 C)  Resp: 16    Intake/Output Summary (Last 24 hours) at 07/12/13 0844 Last data filed at 07/11/13 1800  Gross per 24 hour  Intake    220 ml  Output      0 ml  Net    220 ml   Filed Weights   07/09/13 1004  Weight: 40.824 kg (90 lb)    Exam:   General:  NAD  Cardiovascular: regular rate rhythm no murmurs rubs or gallops.  Respiratory: clear to auscultation bilaterally.  Abdomen: soft, nontender, nondistended, positive bowel sounds  Musculoskeletal: no  clubbing cyanosis or edema.  Data Reviewed: Basic Metabolic Panel:  Recent Labs Lab 07/09/13 1115 07/10/13 0615 07/11/13 0458 07/12/13 0500  NA 138 141 136 135  K 3.2* 3.9 3.7 4.1  CL 99 103 101 100  CO2 28 26 26 23   GLUCOSE 137* 143* 113* 130*  BUN 15 16 21  39*  CREATININE 1.03 1.03 1.12* 1.50*  CALCIUM 8.7 8.4 7.9* 7.8*   Liver Function Tests: No results found for this basename: AST, ALT, ALKPHOS, BILITOT, PROT, ALBUMIN,  in the last 168 hours No results found for this basename: LIPASE, AMYLASE,  in the last 168 hours No results found for this basename: AMMONIA,  in the last 168 hours CBC:  Recent Labs Lab 07/09/13 1115 07/10/13 0615 07/11/13 0458 07/11/13 1350 07/12/13 0500  WBC 7.6 5.7 7.2 6.6 11.6*  NEUTROABS 6.1  --   --   --   --   HGB 10.7* 9.5* 8.2* 8.0* 7.7*  HCT 32.5* 28.6* 24.6* 23.7* 22.9*  MCV 90.0 89.4 90.1 90.1 90.5  PLT 216 207 151 136* 140*   Cardiac Enzymes: No results found for this basename: CKTOTAL, CKMB, CKMBINDEX, TROPONINI,  in the last 168 hours BNP (last 3 results) No results found for this basename: PROBNP,  in the last 8760 hours CBG: No results found for this basename: GLUCAP,  in the last 168 hours  Recent Results (from the past 240 hour(s))  SURGICAL PCR SCREEN     Status: Abnormal   Collection Time    07/09/13  4:04 PM      Result Value Range Status   MRSA, PCR POSITIVE (*) NEGATIVE Final  Staphylococcus aureus POSITIVE (*) NEGATIVE Final   Comment:            The Xpert SA Assay (FDA     approved for NASAL specimens     in patients over 45 years of age),     is one component of     a comprehensive surveillance     program.  Test performance has     been validated by [employer] for patients greater     than or equal to 46 year old.     It is not intended     to diagnose infection nor to     guide or monitor treatment.     Studies: No results found.  Scheduled Meds: . amLODipine  5 mg Oral Daily  . aspirin  EC  325 mg Oral Q breakfast  . Chlorhexidine Gluconate Cloth  6 each Topical Q0600  . cloNIDine  0.3 mg Oral BID  . hydrALAZINE  50 mg Oral QID  . levothyroxine  75 mcg Oral QAC breakfast  . losartan  100 mg Oral Daily  . mupirocin ointment   Nasal BID   Continuous Infusions:   Principal Problem:   Hip fracture, right Active Problems:   PAD (peripheral artery disease)   Hyperlipidemia   Hypertension   Alzheimer's dementia   Chronic pain syndrome   Asthma   Anxiety   Closed right hip fracture   Protein-calorie malnutrition, severe   Palliative care encounter   Weakness generalized    Time spent: 30 minutes    THOMPSON,DANIEL M.D. [facility] Pager [phone]. If 7PM-7AM, please contact night-coverage at [email or web address], password [facility] 07/12/2013, 8:44 AM  LOS: 3 days

## 2013-07-12 NOTE — Progress Notes (Signed)
Subjective: 3 Days Post-Op Procedure(s) (LRB): INTRAMEDULLARY (IM) NAIL FEMORAL (Right) Patient reports pain as mild.  Difficult to evaluate because of her Dementia. No obvious major problems. Hbg 7.7 will follow Hbg.  Objective: Vital signs in last 24 hours: Temp:  [97.6 F (36.4 C)-98.1 F (36.7 C)] 97.6 F (36.4 C) (12/25 0536) Pulse Rate:  [67-77] 70 (12/25 0536) Resp:  [16-18] 16 (12/25 0536) BP: (145-185)/(48-66) 174/59 mmHg (12/25 0536) SpO2:  [92 %-98 %] 95 % (12/25 0536)  Intake/Output from previous day: 12/24 0701 - 12/25 0700 In: 220 [P.O.:220] Out: -  Intake/Output this shift:     Recent Labs  07/09/13 1115 07/10/13 0615 07/11/13 0458 07/11/13 1350 07/12/13 0500  HGB 10.7* 9.5* 8.2* 8.0* 7.7*    Recent Labs  07/11/13 1350 07/12/13 0500  WBC 6.6 11.6*  RBC 2.63* 2.53*  HCT 23.7* 22.9*  PLT 136* 140*    Recent Labs  07/11/13 0458 07/12/13 0500  NA 136 135  K 3.7 4.1  CL 101 100  CO2 26 23  BUN 21 39*  CREATININE 1.12* 1.50*  GLUCOSE 113* 130*  CALCIUM 7.9* 7.8*   No results found for this basename: LABPT, INR,  in the last 72 hours  Not very easy to evaluate because of her Dementia.  Assessment/Plan: 3 Days Post-Op Procedure(s) (LRB): INTRAMEDULLARY (IM) NAIL FEMORAL (Right) Up with therapy  Leeandre Nordling A 07/12/2013, 8:11 AM

## 2013-07-12 NOTE — Progress Notes (Signed)
ANTIBIOTIC CONSULT NOTE - INITIAL  Pharmacy Consult for vancomycin and zosyn Indication: pneumonia  Allergies  Allergen Reactions  . Celebrex [Celecoxib]     unknown  . Epinephrine     unsure  . Lidocaine     unsure  . Lyrica [Pregabalin]     Caused her to spit up excessively and became dehydrated  . Morphine And Related Other (See Comments)    hallucinations  . Sulfa Antibiotics     unsure  . Ultram [Tramadol Hcl] Other (See Comments)    Spit up and become dehydrated    Patient Measurements: Height: 5\' 4"  (162.6 cm) Weight: 90 lb (40.824 kg) IBW/kg (Calculated) : 54.7   Vital Signs: Temp: 98.4 F (36.9 C) (12/25 1640) Temp src: Oral (12/25 1640) BP: 191/48 mmHg (12/25 1640) Pulse Rate: 84 (12/25 1640) Intake/Output from previous day: 12/24 0701 - 12/25 0700 In: 220 [P.O.:220] Out: -  Intake/Output from this shift: Total I/O In: 565 [P.O.:50; Blood:15; IV Piggyback:500] Out: 400 [Urine:400]  Labs:  Recent Labs  07/10/13 0615 07/11/13 0458 07/11/13 1350 07/12/13 0500 07/12/13 1105  WBC 5.7 7.2 6.6 11.6*  --   HGB 9.5* 8.2* 8.0* 7.7*  --   PLT 207 151 136* 140*  --   LABCREA  --   --   --   --  118.53  CREATININE 1.03 1.12*  --  1.50*  --    Estimated Creatinine Clearance: 15.4 ml/min (by C-G formula based on Cr of 1.5). No results found for this basename: VANCOTROUGH, Leodis Binet, VANCORANDOM, GENTTROUGH, GENTPEAK, GENTRANDOM, TOBRATROUGH, TOBRAPEAK, TOBRARND, AMIKACINPEAK, AMIKACINTROU, AMIKACIN,  in the last 72 hours   Microbiology: Recent Results (from the past 720 hour(s))  SURGICAL PCR SCREEN     Status: Abnormal   Collection Time    07/09/13  4:04 PM      Result Value Range Status   MRSA, PCR POSITIVE (*) NEGATIVE Final   Staphylococcus aureus POSITIVE (*) NEGATIVE Final   Comment:            The Xpert SA Assay (FDA     approved for NASAL specimens     in patients over 20 years of age),     is one component of     a comprehensive  surveillance     program.  Test performance has     been validated by [employer] for patients greater     than or equal to 29 year old.     It is not intended     to diagnose infection nor to     guide or monitor treatment.    Medical History: Past Medical History  Diagnosis Date  . Hyperlipidemia   . Hypertension   . Arthritis   . Anxiety   . Asthma   . Leg pain     with walking  . Peripheral arterial disease   . Dementia     Medications:  Prescriptions prior to admission  Medication Sig Dispense Refill  . ALPRAZolam (XANAX) 0.25 MG tablet Take 0.25-0.5 mg by mouth 3 (three) times daily as needed for anxiety.      Marland Kitchen amLODipine (NORVASC) 5 MG tablet Take 5 mg by mouth daily.      . cloNIDine (CATAPRES) 0.3 MG tablet Take 0.3 mg by mouth 2 (two) times daily.      . fluticasone (FLONASE) 50 MCG/ACT nasal spray Place 1 spray into both nostrils daily as needed for allergies or rhinitis.      Marland Kitchen  hydrALAZINE (APRESOLINE) 50 MG tablet Take 1 tablet (50 mg total) by mouth 4 (four) times daily.  360 tablet  4  . ibuprofen (ADVIL,MOTRIN) 200 MG tablet Take 200 mg by mouth 3 (three) times daily.      Marland Kitchen levothyroxine (SYNTHROID, LEVOTHROID) 75 MCG tablet Take 1 tablet (75 mcg total) by mouth 1 day or 1 dose.  90 tablet  4  . losartan (COZAAR) 100 MG tablet Take 1 tablet (100 mg total) by mouth daily. Take one tablet once daily for blood pressure  90 tablet  4  . oxyCODONE (OXY IR/ROXICODONE) 5 MG immediate release tablet Take 5 mg by mouth 3 (three) times daily.      Marland Kitchen oxyCODONE (OXY IR/ROXICODONE) 5 MG immediate release tablet Take 5 mg by mouth daily as needed for severe pain (if patient wakes up from pain).      . polyethylene glycol (MIRALAX / GLYCOLAX) packet Take 17 g by mouth daily as needed for mild constipation.       Assessment: 79 yo lady to start vancomycin and zosyn for PNA.  Her CrCl ~15 ml/min.    Goal of Therapy:  Vancomycin trough level 15-20 mcg/ml  Plan:   Zosyn 2.25 gm IV q6 hours Vancomycin 750 mg IV X 1. Will f/u renal function for future vanc doses. F/u clinical status and cultures.  Melchizedek Espinola Poteet 07/12/2013,5:42 PM

## 2013-07-13 DIAGNOSIS — J189 Pneumonia, unspecified organism: Secondary | ICD-10-CM

## 2013-07-13 LAB — TYPE AND SCREEN
ABO/RH(D): A POS
Antibody Screen: NEGATIVE
Unit division: 0

## 2013-07-13 LAB — BASIC METABOLIC PANEL
BUN: 34 mg/dL — ABNORMAL HIGH (ref 6–23)
Calcium: 8 mg/dL — ABNORMAL LOW (ref 8.4–10.5)
Chloride: 105 mEq/L (ref 96–112)
GFR calc Af Amer: 47 mL/min — ABNORMAL LOW (ref >90)
GFR calc non Af Amer: 40 mL/min — ABNORMAL LOW (ref >90)
Potassium: 3.8 mEq/L (ref 3.5–5.1)
Sodium: 140 mEq/L (ref 135–145)

## 2013-07-13 LAB — CBC
HCT: 36.9 % (ref 36.0–46.0)
Hemoglobin: 12.8 g/dL (ref 12.0–15.0)
MCHC: 34.7 g/dL (ref 30.0–36.0)
Platelets: 157 10*3/uL (ref 150–400)
RDW: 15.2 % (ref 11.5–15.5)
WBC: 10 10*3/uL (ref 4.0–10.5)

## 2013-07-13 LAB — URINE CULTURE

## 2013-07-13 MED ORDER — SODIUM CHLORIDE 0.9 % IV SOLN
500.0000 mg | INTRAVENOUS | Status: DC
  Administered 2013-07-13: 500 mg via INTRAVENOUS
  Filled 2013-07-13 (×2): qty 500

## 2013-07-13 MED ORDER — RESOURCE THICKENUP CLEAR PO POWD
ORAL | Status: DC | PRN
  Filled 2013-07-13: qty 125

## 2013-07-13 MED ORDER — PIPERACILLIN-TAZOBACTAM IN DEX 2-0.25 GM/50ML IV SOLN
2.2500 g | Freq: Three times a day (TID) | INTRAVENOUS | Status: DC
  Administered 2013-07-13 – 2013-07-14 (×2): 2.25 g via INTRAVENOUS
  Filled 2013-07-13 (×4): qty 50

## 2013-07-13 MED ORDER — HYDRALAZINE HCL 20 MG/ML IJ SOLN
10.0000 mg | Freq: Once | INTRAMUSCULAR | Status: AC
  Administered 2013-07-13: 10 mg via INTRAVENOUS
  Filled 2013-07-13: qty 1

## 2013-07-13 MED ORDER — AMLODIPINE BESYLATE 10 MG PO TABS
10.0000 mg | ORAL_TABLET | Freq: Every day | ORAL | Status: DC
  Administered 2013-07-13: 5 mg via ORAL
  Administered 2013-07-14: 10 mg via ORAL
  Filled 2013-07-13 (×2): qty 1

## 2013-07-13 MED ORDER — POLYETHYLENE GLYCOL 3350 17 G PO PACK
17.0000 g | PACK | Freq: Every day | ORAL | Status: DC
  Administered 2013-07-13: 17 g via ORAL
  Filled 2013-07-13 (×2): qty 1

## 2013-07-13 NOTE — Progress Notes (Signed)
[facility] PROGRESS NOTE  [name]:[id number] DOB: [birth year] DOA: 07/09/2013 PCP: Bufford Spikes, DO  Assessment/Plan: Acute intertrochanteric right hip fracture  Patient status post ORIF with IM to right femur. PT/OT. Pain management. Per orthopedics. ARF Likely secondary to prerenal azotemia. Improving with hydration. Decrease IVF. Follow. Severe iron deficiency/Anemia/ABLA Likely acute blood loss anemia secondary to surgery. Anemia panel c/w iron deficency anemia. S/p 2 units PRBCs. H/H stable. Follow. Prob HCAP Per CXR. Patient also with cough. Patient started on IV Zosyn. Follow. Uncontrolled hypertension  likely secondary to pain. Some improvement with pain control. Continue her home medications of amlodipine, clonidine, hydralazine, losartan.  Hypothyroidism  Continue levothyroxine  Anxiety  Continue Xanax  Asthma  Currently on no home medications, will continue to monitor. Peripheral arterial disease  Currently stable.  Alzheimer's dementia  Currently on no medications. Will continue to monitor.   Code Status: DO NOT RESUSCITATE Family Communication: updated son at bedside. Disposition Plan: home with home health   Consultants:  Orthopedics: Dr. Shelle Iron 07/09/2013  Palliative care  Procedures:  ORIF with IM of right proximal femur per Dr. Shelle Iron 07/09/2013  2 units PRBCs 07/12/13  CXR 07/12/13  Antibiotics:  IV Zosyn 07/12/13  HPI/Subjective: Patient alert. Patient sitting in chair.  Objective: Filed Vitals:   07/13/13 1129  BP:   Pulse:   Temp:   Resp: 18    Intake/Output Summary (Last 24 hours) at 07/13/13 1219 Last data filed at 07/13/13 0900  Gross per 24 hour  Intake  822.5 ml  Output    500 ml  Net  322.5 ml   Filed Weights   07/09/13 1004  Weight: 40.824 kg (90 lb)    Exam:   General:  NAD  Cardiovascular: regular rate rhythm no murmurs rubs or gallops.  Respiratory: clear to auscultation  bilaterally.  Abdomen: soft, nontender, nondistended, positive bowel sounds  Musculoskeletal: no clubbing cyanosis or edema.  Data Reviewed: Basic Metabolic Panel:  Recent Labs Lab 07/09/13 1115 07/10/13 0615 07/11/13 0458 07/12/13 0500 07/13/13 0500  NA 138 141 136 135 140  K 3.2* 3.9 3.7 4.1 3.8  CL 99 103 101 100 105  CO2 28 26 26 23 25   GLUCOSE 137* 143* 113* 130* 91  BUN 15 16 21  39* 34*  CREATININE 1.03 1.03 1.12* 1.50* 1.14*  CALCIUM 8.7 8.4 7.9* 7.8* 8.0*   Liver Function Tests: No results found for this basename: AST, ALT, ALKPHOS, BILITOT, PROT, ALBUMIN,  in the last 168 hours No results found for this basename: LIPASE, AMYLASE,  in the last 168 hours No results found for this basename: AMMONIA,  in the last 168 hours CBC:  Recent Labs Lab 07/09/13 1115 07/10/13 0615 07/11/13 0458 07/11/13 1350 07/12/13 0500 07/13/13 0500  WBC 7.6 5.7 7.2 6.6 11.6* 10.0  NEUTROABS 6.1  --   --   --   --   --   HGB 10.7* 9.5* 8.2* 8.0* 7.7* 12.8  HCT 32.5* 28.6* 24.6* 23.7* 22.9* 36.9  MCV 90.0 89.4 90.1 90.1 90.5 86.0  PLT 216 207 151 136* 140* 157   Cardiac Enzymes: No results found for this basename: CKTOTAL, CKMB, CKMBINDEX, TROPONINI,  in the last 168 hours BNP (last 3 results) No results found for this basename: PROBNP,  in the last 8760 hours CBG: No results found for this basename: GLUCAP,  in the last 168 hours  Recent Results (from the past 240 hour(s))  SURGICAL PCR SCREEN     Status: Abnormal  Collection Time    07/09/13  4:04 PM      Result Value Range Status   MRSA, PCR POSITIVE (*) NEGATIVE Final   Staphylococcus aureus POSITIVE (*) NEGATIVE Final   Comment:            The Xpert SA Assay (FDA     approved for NASAL specimens     in patients over 77 years of age),     is one component of     a comprehensive surveillance     program.  Test performance has     been validated by [employer] for patients greater     than or equal to 1 year  old.     It is not intended     to diagnose infection nor to     guide or monitor treatment.  URINE CULTURE     Status: None   Collection Time    07/12/13 11:05 AM      Result Value Range Status   Specimen Description URINE, CATHETERIZED   Final   Special Requests NONE   Final   Culture  Setup Time     Final   Value: 07/12/2013 12:00     Performed at [facility] Count     Final   Value: NO GROWTH     Performed at [facility]   Culture     Final   Value: NO GROWTH     Performed at [facility]   Report Status 07/13/2013 FINAL   Final     Studies: Dg Chest Port 1 View  07/12/2013   CLINICAL DATA:  Cough  EXAM: PORTABLE CHEST - 1 VIEW  COMPARISON:  07/09/2013  FINDINGS: Mild cardiomegaly without CHF or edema. Mild hyperinflation, suspect COPD/emphysema. Right lung clear. Left lower lobe retrocardiac consolidation noted, pneumonia not excluded. Question small left effusion. No pneumothorax. Degenerative changes of the spine with an associated scoliosis. Atherosclerosis of the aorta present.  IMPRESSION: Developing left lower lobe retrocardiac consolidation concerning for pneumonia.   Electronically Signed   By: Ruel Favors M.D.   On: 07/12/2013 12:18    Scheduled Meds: . amLODipine  10 mg Oral Daily  . aspirin EC  325 mg Oral Q breakfast  . Chlorhexidine Gluconate Cloth  6 each Topical Q0600  . cloNIDine  0.3 mg Oral BID  . hydrALAZINE  50 mg Oral QID  . levothyroxine  75 mcg Oral QAC breakfast  . mupirocin ointment   Nasal BID  . piperacillin-tazobactam (ZOSYN)  IV  2.25 g Intravenous Q8H  . vancomycin  500 mg Intravenous Q24H   Continuous Infusions: . sodium chloride 75 mL/hr at 07/12/13 1308    Principal Problem:   Hip fracture, right Active Problems:   PAD (peripheral artery disease)   Hyperlipidemia   Hypertension   Alzheimer's dementia   Chronic pain syndrome   Asthma   Anxiety   Closed right hip fracture   Protein-calorie  malnutrition, severe   Palliative care encounter   Weakness generalized   ARF (acute renal failure)   Anemia   HCAP (healthcare-associated pneumonia)    Time spent: 30 minutes    Annalissa Murphey M.D. [facility] Pager [phone]. If 7PM-7AM, please contact night-coverage at [email or web address], password [facility] 07/13/2013, 12:19 PM  LOS: 4 days

## 2013-07-13 NOTE — Progress Notes (Signed)
Physical Therapy Treatment Patient Details Name: [name] MRN: [id number] DOB: Apr 12, 1919 Today's Date: 07/13/2013 Time: 4098-1191 PT Time Calculation (min): 23 min  PT Assessment / Plan / Recommendation  History of Present Illness Pt is a 79 y/o female admitted s/p IM nailing of R femoral fracture. Pt is now PWB 50%.   PT Comments   Very slow progress, but able to participate in transfer training; DC home tends to be the most therapeutic place for pt's with dementia, with familiar environment, caregivers,  and routine -- therefore dc recommendation remains to be for going home with her 24 hour caregivers;   Worth considering hospital bed, wheelchair and wheelchair cushion    Follow Up Recommendations  Home health PT;Supervision/Assistance - 24 hour     Does the patient have the potential to tolerate intense rehabilitation     Barriers to Discharge        Equipment Recommendations  Hospital bed;Wheelchair (measurements PT);Wheelchair cushion (measurements PT)    Recommendations for Other Services    Frequency Min 5X/week   Progress towards PT Goals Progress towards PT goals: Progressing toward goals  Plan Current plan remains appropriate;Equipment recommendations need to be updated    Precautions / Restrictions Precautions Precautions: Fall Restrictions Weight Bearing Restrictions: Yes RLE Weight Bearing: Partial weight bearing RLE Partial Weight Bearing Percentage or Pounds: 50%   Pertinent Vitals/Pain Grimace with movement;  patient repositioned for comfort     Mobility  Transfers Transfers: Sit to Stand;Stand to Sit;Stand Pivot Transfers Sit to Stand: 3: Mod assist;From chair/3-in-1 Stand to Sit: 1: +1 Total assist;Without upper extremity assist;To chair/3-in-1 Details for Transfer Assistance: Attempted VCs for safe hand placement and sequencing but due to pt's dementia she was unable to follow.  Used the RW with striaght sit to stands from chair today;  performed 4 reps -- needed to stand twice for assist with hygeine as she was incontinent of urine and required washing and changing pads in the chair Ambulation/Gait Ambulation/Gait Assistance: Not tested (comment) Stairs: No Wheelchair Mobility Wheelchair Mobility: No    Exercises     PT Diagnosis:    PT Problem List:   PT Treatment Interventions:     PT Goals (current goals can now be found in the care plan section) Acute Rehab PT Goals Patient Stated Goal: Pt unable to state a goal at this time PT Goal Formulation: With patient/family Time For Goal Achievement: 07/17/13 Potential to Achieve Goals: Fair  Visit Information  Last PT Received On: 07/13/13 Assistance Needed: +2 History of Present Illness: Pt is a 79 y/o female admitted s/p IM nailing of R femoral fracture. Pt is now PWB 50%.    Subjective Data  Subjective: Not wanting to get up, but agreeable with prompting Patient Stated Goal: Pt unable to state a goal at this time   Cognition  Cognition Arousal/Alertness: Awake/alert Behavior During Therapy: WFL for tasks assessed/performed Overall Cognitive Status: History of cognitive impairments - at baseline    Balance  Balance Balance Assessed: Yes Static Sitting Balance Static Sitting - Balance Support: Feet supported;Bilateral upper extremity supported Static Sitting - Level of Assistance: 4: Min assist;3: Mod assist Static Standing Balance Static Standing - Balance Support: Bilateral upper extremity supported Static Standing - Level of Assistance: 3: Mod assist Static Standing - Comment/# of Minutes: standing in front of chair  End of Session PT - End of Session Equipment Utilized During Treatment: Gait belt Activity Tolerance: Patient limited by pain Patient left: in chair;with call bell/phone  within reach Nurse Communication: Mobility status   GP     Van Clines [facility] [place], [employer] [phone]  07/13/2013, 1:04 PM

## 2013-07-13 NOTE — Progress Notes (Signed)
Patient ID: [name], female   DOB: 15-Apr-1919, 79 y.o.   MRN: [id number] Subjective: 4 Days Post-Op Procedure(s) (LRB): INTRAMEDULLARY (IM) NAIL FEMORAL (Right)    Patient remains difficult to assess related to her dementia.  No events, Did receive blood yesterday for anemia related to ABLA versus chronic disease  Objective:   VITALS:   Filed Vitals:   07/13/13 0551  BP: 217/52  Pulse: 79  Temp:   Resp: 18    Incision: dressing C/D/I Moves leg to pain, moves toes  LABS  Recent Labs  07/11/13 1350 07/12/13 0500 07/13/13 0500  HGB 8.0* 7.7* 12.8  HCT 23.7* 22.9* 36.9  WBC 6.6 11.6* 10.0  PLT 136* 140* 157     Recent Labs  07/11/13 0458 07/12/13 0500 07/13/13 0500  NA 136 135 140  K 3.7 4.1 3.8  BUN 21 39* 34*  CREATININE 1.12* 1.50* 1.14*  GLUCOSE 113* 130* 91    No results found for this basename: LABPT, INR,  in the last 72 hours   Assessment/Plan: 4 Days Post-Op Procedure(s) (LRB): INTRAMEDULLARY (IM) NAIL FEMORAL (Right)   Up with therapy Discharge to SNF when deemed medically stable

## 2013-07-14 LAB — CBC
HCT: 40.1 % (ref 36.0–46.0)
Hemoglobin: 13.7 g/dL (ref 12.0–15.0)
MCH: 29.8 pg (ref 26.0–34.0)
MCHC: 34.2 g/dL (ref 30.0–36.0)
Platelets: 171 10*3/uL (ref 150–400)
RDW: 15.7 % — ABNORMAL HIGH (ref 11.5–15.5)

## 2013-07-14 LAB — BASIC METABOLIC PANEL
BUN: 30 mg/dL — ABNORMAL HIGH (ref 6–23)
CO2: 19 mEq/L (ref 19–32)
Calcium: 7.7 mg/dL — ABNORMAL LOW (ref 8.4–10.5)
Creatinine, Ser: 0.84 mg/dL (ref 0.50–1.10)
GFR calc Af Amer: 68 mL/min — ABNORMAL LOW (ref >90)
GFR calc non Af Amer: 59 mL/min — ABNORMAL LOW (ref >90)
Potassium: 3.8 mEq/L (ref 3.5–5.1)

## 2013-07-14 MED ORDER — GUAIFENESIN ER 600 MG PO TB12
600.0000 mg | ORAL_TABLET | Freq: Two times a day (BID) | ORAL | Status: DC
  Administered 2013-07-14: 600 mg via ORAL
  Filled 2013-07-14 (×2): qty 1

## 2013-07-14 MED ORDER — AMLODIPINE BESYLATE 10 MG PO TABS: 10.0000 mg | ORAL_TABLET | Freq: Every day | ORAL | Status: AC

## 2013-07-14 MED ORDER — LEVOFLOXACIN 750 MG PO TABS: 750.0000 mg | ORAL_TABLET | Freq: Every day | ORAL | Status: AC

## 2013-07-14 MED ORDER — GUAIFENESIN ER 600 MG PO TB12: 600.0000 mg | ORAL_TABLET | Freq: Two times a day (BID) | ORAL | Status: DC

## 2013-07-14 MED ORDER — PIPERACILLIN-TAZOBACTAM 3.375 G IVPB
3.3750 g | Freq: Three times a day (TID) | INTRAVENOUS | Status: DC
  Administered 2013-07-14: 3.375 g via INTRAVENOUS
  Filled 2013-07-14 (×3): qty 50

## 2013-07-14 MED ORDER — RESOURCE THICKENUP CLEAR PO POWD: 1.0000 | ORAL | Status: AC | PRN

## 2013-07-14 NOTE — Discharge Summary (Signed)
Physician Discharge Summary  [name] QMV:[id number] DOB: 01-05-1919 DOA: 07/09/2013  PCP: Bufford Spikes, DO  Admit date: 07/09/2013 Discharge date: 07/14/2013  Time spent: 70 minutes  Recommendations for Outpatient Follow-up:  1. Followup with Dr. Shelle Iron of orthopedics in 2 weeks. 2. Followup with REED, TIFFANY, DO in 1 week. On followup a basic metabolic profile need to be obtained to followup on patient's electrolytes and renal function. Patient pneumonia need to be reassessed. Patient's blood pressure also need to be reassessed. Patient's ARB was discontinued on discharge as during the hospitalization patient had a bout of acute renal failure. Patient's Norvasc was increased to 10 mg and Cozaar discontinued. Patient did need to followup on patient's hypertension.  Discharge Diagnoses:  Principal Problem:   Hip fracture, right Active Problems:   PAD (peripheral artery disease)   Hyperlipidemia   Hypertension   Alzheimer's dementia   Chronic pain syndrome   Asthma   Anxiety   Closed right hip fracture   Protein-calorie malnutrition, severe   Palliative care encounter   Weakness generalized   ARF (acute renal failure)   Anemia   HCAP (healthcare-associated pneumonia)   Discharge Condition: Stable and improved  Diet recommendation: Dysphagia 3 diet with nectar thick liquids  Filed Weights   07/09/13 1004  Weight: 40.824 kg (90 lb)    History of present illness:  [name] is a 79 y.o. female  With a history of hypertension, hyperlipidemia, dementia, peripheral arterial disease that presents emergency department with her son at her experiencing a fall yesterday evening. It seems the patient lost her stool or in the femoral. Patient was not brought in yesterday as family thought her pain was controlled yesterday. Patient is unable to provide any information at this time.   Hospital Course:  Acute intertrochanteric right hip fracture  Patient was admitted with a  right femur fracture secondary to mechanical fall. Patient was seen by orthopedics and patient underwent a ORIF with IM nail to right femur. Patient was seen by physical therapy and recommended home health therapy. Patient's pain was managed throughout the hospitalization. Patient will followup with orthopedics as outpatient. ARF  Likely secondary to prerenal azotemia. Patient's ARB was held. Patient was hydrated with IV fluids with improvement in her renal function. Patient will followup with PCP as outpatient.  Severe iron deficiency/Anemia/ABLA  Likely acute blood loss anemia secondary to surgery. Anemia panel c/w iron deficency anemia. S/p 2 units PRBCs. H/H stable. Follow up as outpatient.  Prob HCAP  Per CXR. Patient also with cough. Patient was placed on IV vancomycin IV Zosyn. Patient remained afebrile. Patient had a normal white count. Patient was discharged home on 6 more days of oral Levaquin as well as Mucinex to complete a one-week course of antibiotic therapy. Patient was discharged in stable and improved condition.  Uncontrolled hypertension  likely secondary to pain. Some improvement with pain control. Continue her home medications of amlodipine, clonidine, hydralazine. Patient's ARB was held secondary to worsening renal function and her Norvasc was increased to 10 mg daily. Patient will followup with PCP as outpatient for further management.  Hypothyroidism  Continued on levothyroxine.  Anxiety  Continue Xanax  Asthma  Currently on no home medications. Remained stable. Peripheral arterial disease  Currently stable.  Alzheimer's dementia  Currently on no medications.  During the hospitalization per family request a palliative care consultation was obtained and patient was seen by the palliative care team. Patient's family focus of care was comfort. Patient was admitted  DO NOT RESUSCITATE. Family did not want any artificial feeding now or in the future. The family wanted to continue  antibiotics when an infection occurred and will be discharged home with home health therapies. It was explained and educated to the family on how to self refer to hospice on the felt ready.   Procedures: ORIF with IM of right proximal femur per Dr. Shelle Iron 07/09/2013  2 units PRBCs 07/12/13  CXR 07/12/13   Consultations: Orthopedics: Dr. Shelle Iron 07/09/2013  Palliative care     Discharge Exam: Filed Vitals:   07/14/13 0534  BP: 161/84  Pulse: 71  Temp: 97.8 F (36.6 C)  Resp: 19    General: Pleasantly confused Cardiovascular: RRR Respiratory: some scattered coarse BS  Discharge Instructions      Discharge Orders   Future Appointments Provider Department Dept Phone   11/26/2013 3:30 PM Kermit Balo, DO [facility] [phone]   Future Orders Complete By Expires   Diet general  As directed    Comments:     Dysphagia 3 diet with nectar thick liquids.   Discharge instructions  As directed    Comments:     Follow up with Dr Shelle Iron orthopedics in 2 weeks. Follow up with REED, TIFFANY, DO in 1 week.       Medication List    STOP taking these medications       losartan 100 MG tablet  Commonly known as:  COZAAR     oxyCODONE 5 MG immediate release tablet  Commonly known as:  Oxy IR/ROXICODONE      TAKE these medications       ALPRAZolam 0.25 MG tablet  Commonly known as:  XANAX  Take 0.25-0.5 mg by mouth 3 (three) times daily as needed for anxiety.     amLODipine 10 MG tablet  Commonly known as:  NORVASC  Take 1 tablet (10 mg total) by mouth daily.     aspirin EC 325 MG tablet  Take 1 tablet (325 mg total) by mouth daily.     cloNIDine 0.3 MG tablet  Commonly known as:  CATAPRES  Take 0.3 mg by mouth 2 (two) times daily.     fluticasone 50 MCG/ACT nasal spray  Commonly known as:  FLONASE  Place 1 spray into both nostrils daily as needed for allergies or rhinitis.     guaiFENesin 600 MG 12 hr tablet  Commonly known as:  MUCINEX  Take 1  tablet (600 mg total) by mouth 2 (two) times daily.     hydrALAZINE 50 MG tablet  Commonly known as:  APRESOLINE  Take 1 tablet (50 mg total) by mouth 4 (four) times daily.     ibuprofen 200 MG tablet  Commonly known as:  ADVIL,MOTRIN  Take 200 mg by mouth 3 (three) times daily.     levofloxacin 750 MG tablet  Commonly known as:  LEVAQUIN  Take 1 tablet (750 mg total) by mouth daily. Take for 6 days then stop.     levothyroxine 75 MCG tablet  Commonly known as:  SYNTHROID, LEVOTHROID  Take 1 tablet (75 mcg total) by mouth 1 day or 1 dose.     oxyCODONE-acetaminophen 5-325 MG per tablet  Commonly known as:  PERCOCET  Take 1-2 tablets by mouth every 4 (four) hours as needed.     polyethylene glycol packet  Commonly known as:  MIRALAX / GLYCOLAX  Take 17 g by mouth daily as needed for mild constipation.     RESOURCE  THICKENUP CLEAR Powd  Take 120 g by mouth as needed (nectar thick liquids.).       Allergies  Allergen Reactions  . Celebrex [Celecoxib]     unknown  . Epinephrine     unsure  . Lidocaine     unsure  . Lyrica [Pregabalin]     Caused her to spit up excessively and became dehydrated  . Morphine And Related Other (See Comments)    hallucinations  . Sulfa Antibiotics     unsure  . Ultram [Tramadol Hcl] Other (See Comments)    Spit up and become dehydrated   Follow-up Information   Follow up with BEANE,JEFFREY C, MD In 2 weeks.   Specialty:  Orthopedic Surgery   Contact information:   [address] [place] [address] [phone]       Follow up with REED, TIFFANY, DO. Schedule an appointment as soon as possible for a visit in 1 week.   Specialty:  Geriatric Medicine   Contact information:   [address] [place] [address] [phone]        The results of significant diagnostics from this hospitalization (including imaging, microbiology, ancillary and laboratory) are listed below for reference.    Significant  Diagnostic Studies: Dg Hip Complete Right  07/09/2013   CLINICAL DATA:  Right hip fracture.  EXAM: RIGHT HIP - COMPLETE 2+ VIEW  COMPARISON:  None.  FINDINGS: Acute intertrochanteric fracture of the proximal right femur present with displacement, angulation and impaction. No dislocation identified. No other pelvic fractures are seen.  IMPRESSION: Acute intertrochanteric right hip fracture.   Electronically Signed   By: Irish Lack M.D.   On: 07/09/2013 13:00   Dg Hip Operative Right  07/09/2013   CLINICAL DATA:  ORIF of the right hip.  EXAM: DG OPERATIVE RIGHT HIP  TECHNIQUE: A single spot fluoroscopic AP image of the right hip is submitted.  COMPARISON:  None.  FINDINGS: Eight fluoroscopic spot films demonstrate gamma nail placement with short intra medullary nail. The IM nail has a distal interlocking screw.  IMPRESSION: Gamma nail fixation of the right intertrochanteric femur fracture.   Electronically Signed   By: Andreas Newport M.D.   On: 07/09/2013 20:24   Dg Pelvis Portable  07/09/2013   CLINICAL DATA:  Postop ORIF of right hip fracture  EXAM: PORTABLE PELVIS 1-2 VIEWS  COMPARISON:  AP pelvis radiographs-earlier same day  FINDINGS: Provided image demonstrates the sequela of intra medullary rod fixation of the proximal femur and dynamic screw fixation of the right femoral neck. Note, the caudal end of the right femoral intra medullary rod is excluded from view.  There is a persistently displaced avulsion fracture of the right lesser trochanter. Otherwise, there is near anatomic alignment given obliquity.  There are expected foci of subcutaneous emphysema about the operative site. Adjacent skin staples. Vascular calcifications.  IMPRESSION: Post ORIF of the right hip and femur without definite evidence of complication.   Electronically Signed   By: Simonne Come M.D.   On: 07/09/2013 21:31   Dg Pelvis Portable  07/09/2013   CLINICAL DATA:  Fall. Obvious right hip deformity at physical  examination.  EXAM: PORTABLE PELVIS 1-2 VIEWS  COMPARISON:  None.  FINDINGS: Comminuted intertrochanteric right femoral neck fracture. Right hip joint anatomically aligned with marked joint space narrowing. Remainder of the pelvis not optimally imaged.  IMPRESSION: Comminuted intertrochanteric right femoral neck fracture.   Electronically Signed   By: Kayren Eaves.D.  On: 07/09/2013 10:30   Dg Chest Port 1 View  07/12/2013   CLINICAL DATA:  Cough  EXAM: PORTABLE CHEST - 1 VIEW  COMPARISON:  07/09/2013  FINDINGS: Mild cardiomegaly without CHF or edema. Mild hyperinflation, suspect COPD/emphysema. Right lung clear. Left lower lobe retrocardiac consolidation noted, pneumonia not excluded. Question small left effusion. No pneumothorax. Degenerative changes of the spine with an associated scoliosis. Atherosclerosis of the aorta present.  IMPRESSION: Developing left lower lobe retrocardiac consolidation concerning for pneumonia.   Electronically Signed   By: Ruel Favors M.D.   On: 07/12/2013 12:18   Dg Chest Portable 1 View  07/09/2013   CLINICAL DATA:  Fall  EXAM: PORTABLE CHEST - 1 VIEW  COMPARISON:  05/20/2012  FINDINGS: Cardiomediastinal silhouette is stable. Hyperinflation. Mild degenerative changes thoracic spine. Atherosclerotic calcifications of thoracic aorta. No acute infiltrate or pulmonary edema.  IMPRESSION: No active disease.  Hyperinflation.   Electronically Signed   By: Natasha Mead M.D.   On: 07/09/2013 11:20    Microbiology: Recent Results (from the past 240 hour(s))  SURGICAL PCR SCREEN     Status: Abnormal   Collection Time    07/09/13  4:04 PM      Result Value Range Status   MRSA, PCR POSITIVE (*) NEGATIVE Final   Staphylococcus aureus POSITIVE (*) NEGATIVE Final   Comment:            The Xpert SA Assay (FDA     approved for NASAL specimens     in patients over 76 years of age),     is one component of     a comprehensive surveillance     program.  Test performance has      been validated by [employer] for patients greater     than or equal to 9 year old.     It is not intended     to diagnose infection nor to     guide or monitor treatment.  URINE CULTURE     Status: None   Collection Time    07/12/13 11:05 AM      Result Value Range Status   Specimen Description URINE, CATHETERIZED   Final   Special Requests NONE   Final   Culture  Setup Time     Final   Value: 07/12/2013 12:00     Performed at [facility] Count     Final   Value: NO GROWTH     Performed at [facility]   Culture     Final   Value: NO GROWTH     Performed at [facility]   Report Status 07/13/2013 FINAL   Final     Labs: Basic Metabolic Panel:  Recent Labs Lab 07/10/13 0615 07/11/13 0458 07/12/13 0500 07/13/13 0500 07/14/13 0500  NA 141 136 135 140 139  K 3.9 3.7 4.1 3.8 3.8  CL 103 101 100 105 107  CO2 26 26 23 25 19   GLUCOSE 143* 113* 130* 91 106*  BUN 16 21 39* 34* 30*  CREATININE 1.03 1.12* 1.50* 1.14* 0.84  CALCIUM 8.4 7.9* 7.8* 8.0* 7.7*   Liver Function Tests: No results found for this basename: AST, ALT, ALKPHOS, BILITOT, PROT, ALBUMIN,  in the last 168 hours No results found for this basename: LIPASE, AMYLASE,  in the last 168 hours No results found for this basename: AMMONIA,  in the last 168 hours CBC:  Recent Labs  Lab 07/09/13 1115  07/11/13 0458 07/11/13 1350 07/12/13 0500 07/13/13 0500 07/14/13 0500  WBC 7.6  < > 7.2 6.6 11.6* 10.0 8.0  NEUTROABS 6.1  --   --   --   --   --   --   HGB 10.7*  < > 8.2* 8.0* 7.7* 12.8 13.7  HCT 32.5*  < > 24.6* 23.7* 22.9* 36.9 40.1  MCV 90.0  < > 90.1 90.1 90.5 86.0 87.2  PLT 216  < > 151 136* 140* 157 171  < > = values in this interval not displayed. Cardiac Enzymes: No results found for this basename: CKTOTAL, CKMB, CKMBINDEX, TROPONINI,  in the last 168 hours BNP: BNP (last 3 results) No results found for this basename: PROBNP,  in the last 8760  hours CBG: No results found for this basename: GLUCAP,  in the last 168 hours     Signed:  [facility] MD [facility] 07/14/2013, 2:10 PM

## 2013-07-14 NOTE — Progress Notes (Signed)
   CARE MANAGEMENT NOTE 07/14/2013  Patient:  [name], [name]   Account Number:  [id number]  Date Initiated:  07/10/2013  Documentation initiated by:  Vance Peper  Subjective/Objective Assessment:   79 yr old female s/p IM Nailing     Action/Plan:   [name] spoke with patient and son Mr. Hoffman concerning need for [facility]. Choice offered. patient has constant care providers at home. Will require ambulance transport home. Social worker notified .   Anticipated DC Date:  07/12/2013   Anticipated DC Plan:  HOME W HOME HEALTH SERVICES  In-house referral  Clinical Social Worker      DC Planning Services  [name] consult      [facility] Choice  HOME HEALTH   Choice offered to / List presented to:  C-4 Adult Children        HH arranged  HH-2 PT  HH-3 OT  HH-6 SOCIAL WORKER  HH-4 NURSE'S AIDE      HH agency  [facility]   Status of service:  Completed, signed off Medicare Important Message given?   (If response is "NO", the following Medicare IM given date fields will be blank) Date Medicare IM given:   Date Additional Medicare IM given:    Discharge Disposition:  HOME W HOME HEALTH SERVICES  Per UR Regulation:    If discussed at Long Length of Stay Meetings, dates discussed:    Comments:  07/14/13 14:35 [name] spoke with buddy and Hardie Lora (sons of pt) for choice.  Hardie Lora arranges all healthcare for his mother.  Loistine Chance can be reached at [phone].  Pt will receive HHPT/OT/aide and Social worker at following address:  [address] [place], [place] [address].  [facility] chosen for [facility] services.  Bed and 3n1 commode does not need to be ordered as Loistine Chance states his mother already has both at the [address] residence. Ambulance transport home is being arranged with CSW.  CSW called and given correct receiving address.   Referral faxed to [facility].  No other [name] needs were communicated.  [name], [name], [name] [phone].

## 2013-07-14 NOTE — Progress Notes (Signed)
   Subjective: 5 Days Post-Op Procedure(s) (LRB): INTRAMEDULLARY (IM) NAIL FEMORAL (Right) Patient does not complain of any pain when directly asked. Patient seen in rounds with Dr. Shelle Iron. Patient has dementia but does not appear to be in any distress at this time. Plan is to go Skilled nursing facility after hospital stay as per Medicine service.  Objective: Vital signs in last 24 hours: Temp:  [97.7 F (36.5 C)-98.1 F (36.7 C)] 97.8 F (36.6 C) (12/27 0534) Pulse Rate:  [71-81] 71 (12/27 0534) Resp:  [16-19] 19 (12/27 0534) BP: (149-196)/(40-93) 161/84 mmHg (12/27 0534) SpO2:  [94 %-97 %] 97 % (12/27 0534)  Intake/Output from previous day:  Intake/Output Summary (Last 24 hours) at 07/14/13 0811 Last data filed at 07/14/13 0541  Gross per 24 hour  Intake   1440 ml  Output    150 ml  Net   1290 ml    Intake/Output this shift:    Labs:  Recent Labs  07/11/13 1350 07/12/13 0500 07/13/13 0500 07/14/13 0500  HGB 8.0* 7.7* 12.8 13.7    Recent Labs  07/13/13 0500 07/14/13 0500  WBC 10.0 8.0  RBC 4.29 4.60  HCT 36.9 40.1  PLT 157 171    Recent Labs  07/13/13 0500 07/14/13 0500  NA 140 139  K 3.8 3.8  CL 105 107  CO2 25 19  BUN 34* 30*  CREATININE 1.14* 0.84  GLUCOSE 91 106*  CALCIUM 8.0* 7.7*   No results found for this basename: LABPT, INR,  in the last 72 hours  EXAM General - Patient is Alert Extremity - Neurovascular intact Sensation intact distally Dressing/Incision - clean, dry, no drainage Motor Function - intact, moving foot and toes well on exam.   Past Medical History  Diagnosis Date  . Hyperlipidemia   . Hypertension   . Arthritis   . Anxiety   . Asthma   . Leg pain     with walking  . Peripheral arterial disease   . Dementia     Assessment/Plan: 5 Days Post-Op Procedure(s) (LRB): INTRAMEDULLARY (IM) NAIL FEMORAL (Right) Principal Problem:   Hip fracture, right Active Problems:   PAD (peripheral artery disease)  Hyperlipidemia   Hypertension   Alzheimer's dementia   Chronic pain syndrome   Asthma   Anxiety   Closed right hip fracture   Protein-calorie malnutrition, severe   Palliative care encounter   Weakness generalized   ARF (acute renal failure)   Anemia   HCAP (healthcare-associated pneumonia)  Estimated body mass index is 15.44 kg/(m^2) as calculated from the following:   Height as of this encounter: 5\' 4"  (1.626 m).   Weight as of this encounter: 40.824 kg (90 lb). Up with therapy Discharge to SNF as per Medicine.  DVT Prophylaxis - Aspirin Partial Weight Bearing 50% to right Leg  Weltha Cathy 07/14/2013, 8:11 AM

## 2013-07-14 NOTE — Progress Notes (Signed)
Clinical Child psychotherapist (CSW) arranged non-emergency EMS ([facility]) for patient to transport home. CSW verified the address with patient's son Hardie Lora [phone]. The address patient is going to is [address], [place], [place] [address]. EMS has been called. Nursing is aware of above. Please reconsult if further social work needs arise. CSW signing off.   Jetta Lout, LCSWA Weekend CSW [phone]

## 2013-07-15 DIAGNOSIS — J45909 Unspecified asthma, uncomplicated: Secondary | ICD-10-CM

## 2013-07-15 DIAGNOSIS — F0390 Unspecified dementia without behavioral disturbance: Secondary | ICD-10-CM

## 2013-07-15 DIAGNOSIS — S7290XD Unspecified fracture of unspecified femur, subsequent encounter for closed fracture with routine healing: Secondary | ICD-10-CM

## 2013-07-15 DIAGNOSIS — IMO0001 Reserved for inherently not codable concepts without codable children: Secondary | ICD-10-CM

## 2013-07-17 NOTE — Addendum Note (Signed)
Addendum created 07/17/13 1532 by Bedelia Person, MD   Modules edited: Anesthesia Responsible Staff

## 2013-07-21 ENCOUNTER — Inpatient Hospital Stay (HOSPITAL_COMMUNITY)
Admission: EM | Admit: 2013-07-21 | Discharge: 2013-07-25 | DRG: 193 | Disposition: A | Discharge status: Skilled Nursing Facility | Payer: Medicare Other | Attending: Internal Medicine | Admitting: Internal Medicine

## 2013-07-21 ENCOUNTER — Emergency Department (HOSPITAL_COMMUNITY): Payer: Medicare Other

## 2013-07-21 ENCOUNTER — Encounter (HOSPITAL_COMMUNITY): Payer: Self-pay | Admitting: Emergency Medicine

## 2013-07-21 DIAGNOSIS — E41 Nutritional marasmus: Secondary | ICD-10-CM | POA: Diagnosis present

## 2013-07-21 DIAGNOSIS — Z7982 Long term (current) use of aspirin: Secondary | ICD-10-CM

## 2013-07-21 DIAGNOSIS — Z66 Do not resuscitate: Secondary | ICD-10-CM | POA: Diagnosis present

## 2013-07-21 DIAGNOSIS — L89509 Pressure ulcer of unspecified ankle, unspecified stage: Secondary | ICD-10-CM

## 2013-07-21 DIAGNOSIS — E870 Hyperosmolality and hypernatremia: Secondary | ICD-10-CM | POA: Diagnosis present

## 2013-07-21 DIAGNOSIS — N179 Acute kidney failure, unspecified: Secondary | ICD-10-CM

## 2013-07-21 DIAGNOSIS — I739 Peripheral vascular disease, unspecified: Secondary | ICD-10-CM | POA: Diagnosis present

## 2013-07-21 DIAGNOSIS — J189 Pneumonia, unspecified organism: Secondary | ICD-10-CM | POA: Diagnosis present

## 2013-07-21 DIAGNOSIS — G9340 Encephalopathy, unspecified: Secondary | ICD-10-CM | POA: Diagnosis present

## 2013-07-21 DIAGNOSIS — J45909 Unspecified asthma, uncomplicated: Secondary | ICD-10-CM | POA: Diagnosis present

## 2013-07-21 DIAGNOSIS — Z87891 Personal history of nicotine dependence: Secondary | ICD-10-CM

## 2013-07-21 DIAGNOSIS — F411 Generalized anxiety disorder: Secondary | ICD-10-CM | POA: Diagnosis present

## 2013-07-21 DIAGNOSIS — S72001A Fracture of unspecified part of neck of right femur, initial encounter for closed fracture: Secondary | ICD-10-CM

## 2013-07-21 DIAGNOSIS — Z9889 Other specified postprocedural states: Secondary | ICD-10-CM

## 2013-07-21 DIAGNOSIS — Z681 Body mass index (BMI) 19 or less, adult: Secondary | ICD-10-CM

## 2013-07-21 DIAGNOSIS — Z515 Encounter for palliative care: Secondary | ICD-10-CM

## 2013-07-21 DIAGNOSIS — R531 Weakness: Secondary | ICD-10-CM | POA: Diagnosis present

## 2013-07-21 DIAGNOSIS — J09X2 Influenza due to identified novel influenza A virus with other respiratory manifestations: Secondary | ICD-10-CM

## 2013-07-21 DIAGNOSIS — R5381 Other malaise: Secondary | ICD-10-CM

## 2013-07-21 DIAGNOSIS — D72829 Elevated white blood cell count, unspecified: Secondary | ICD-10-CM | POA: Diagnosis present

## 2013-07-21 DIAGNOSIS — R5383 Other fatigue: Secondary | ICD-10-CM

## 2013-07-21 DIAGNOSIS — F419 Anxiety disorder, unspecified: Secondary | ICD-10-CM

## 2013-07-21 DIAGNOSIS — F0280 Dementia in other diseases classified elsewhere without behavioral disturbance: Secondary | ICD-10-CM | POA: Diagnosis present

## 2013-07-21 DIAGNOSIS — E785 Hyperlipidemia, unspecified: Secondary | ICD-10-CM | POA: Diagnosis present

## 2013-07-21 DIAGNOSIS — I10 Essential (primary) hypertension: Secondary | ICD-10-CM | POA: Diagnosis present

## 2013-07-21 DIAGNOSIS — Z79899 Other long term (current) drug therapy: Secondary | ICD-10-CM

## 2013-07-21 DIAGNOSIS — E039 Hypothyroidism, unspecified: Secondary | ICD-10-CM | POA: Diagnosis present

## 2013-07-21 DIAGNOSIS — D649 Anemia, unspecified: Secondary | ICD-10-CM

## 2013-07-21 DIAGNOSIS — E876 Hypokalemia: Secondary | ICD-10-CM

## 2013-07-21 DIAGNOSIS — G309 Alzheimer's disease, unspecified: Secondary | ICD-10-CM | POA: Diagnosis present

## 2013-07-21 DIAGNOSIS — E43 Unspecified severe protein-calorie malnutrition: Secondary | ICD-10-CM

## 2013-07-21 DIAGNOSIS — Z4789 Encounter for other orthopedic aftercare: Secondary | ICD-10-CM

## 2013-07-21 DIAGNOSIS — J690 Pneumonitis due to inhalation of food and vomit: Secondary | ICD-10-CM | POA: Diagnosis present

## 2013-07-21 DIAGNOSIS — J1100 Influenza due to unidentified influenza virus with unspecified type of pneumonia: Principal | ICD-10-CM | POA: Diagnosis present

## 2013-07-21 DIAGNOSIS — G894 Chronic pain syndrome: Secondary | ICD-10-CM

## 2013-07-21 LAB — COMPREHENSIVE METABOLIC PANEL
ALBUMIN: 2.3 g/dL — AB (ref 3.5–5.2)
ALT: 10 U/L (ref 0–35)
AST: 20 U/L (ref 0–37)
Alkaline Phosphatase: 95 U/L (ref 39–117)
BILIRUBIN TOTAL: 0.7 mg/dL (ref 0.3–1.2)
BUN: 22 mg/dL (ref 6–23)
CHLORIDE: 110 meq/L (ref 96–112)
CO2: 26 mEq/L (ref 19–32)
Calcium: 8.4 mg/dL (ref 8.4–10.5)
Creatinine, Ser: 1.1 mg/dL (ref 0.50–1.10)
GFR calc Af Amer: 49 mL/min — ABNORMAL LOW (ref >90)
GFR calc non Af Amer: 42 mL/min — ABNORMAL LOW (ref >90)
Glucose, Bld: 113 mg/dL — ABNORMAL HIGH (ref 70–99)
Potassium: 3.6 mEq/L — ABNORMAL LOW (ref 3.7–5.3)
Sodium: 148 mEq/L — ABNORMAL HIGH (ref 137–147)
TOTAL PROTEIN: 6.3 g/dL (ref 6.0–8.3)

## 2013-07-21 LAB — CBC WITH DIFFERENTIAL/PLATELET
BASOS ABS: 0 10*3/uL (ref 0.0–0.1)
BASOS PCT: 0 % (ref 0–1)
EOS ABS: 0 10*3/uL (ref 0.0–0.7)
Eosinophils Relative: 0 % (ref 0–5)
HCT: 34.6 % — ABNORMAL LOW (ref 36.0–46.0)
Hemoglobin: 11.1 g/dL — ABNORMAL LOW (ref 12.0–15.0)
Lymphocytes Relative: 7 % — ABNORMAL LOW (ref 12–46)
Lymphs Abs: 1.1 10*3/uL (ref 0.7–4.0)
MCH: 29.1 pg (ref 26.0–34.0)
MCHC: 32.1 g/dL (ref 30.0–36.0)
MCV: 90.6 fL (ref 78.0–100.0)
Monocytes Absolute: 1 10*3/uL (ref 0.1–1.0)
Monocytes Relative: 6 % (ref 3–12)
NEUTROS ABS: 13.9 10*3/uL — AB (ref 1.7–7.7)
NEUTROS PCT: 87 % — AB (ref 43–77)
Platelets: 427 10*3/uL — ABNORMAL HIGH (ref 150–400)
RBC: 3.82 MIL/uL — ABNORMAL LOW (ref 3.87–5.11)
RDW: 15.6 % — AB (ref 11.5–15.5)
WBC: 16 10*3/uL — ABNORMAL HIGH (ref 4.0–10.5)

## 2013-07-21 LAB — CG4 I-STAT (LACTIC ACID): LACTIC ACID, VENOUS: 1.64 mmol/L (ref 0.5–2.2)

## 2013-07-21 MED ORDER — CEFEPIME HCL 1 G IJ SOLR
1.0000 g | Freq: Three times a day (TID) | INTRAMUSCULAR | Status: DC
  Filled 2013-07-21: qty 1

## 2013-07-21 MED ORDER — DEXTROSE 5 % IV SOLN
1.0000 g | INTRAVENOUS | Status: DC
  Administered 2013-07-22 – 2013-07-24 (×3): 1 g via INTRAVENOUS
  Filled 2013-07-21 (×4): qty 1

## 2013-07-21 MED ORDER — HYDRALAZINE HCL 50 MG PO TABS
50.0000 mg | ORAL_TABLET | Freq: Four times a day (QID) | ORAL | Status: DC
  Administered 2013-07-22 – 2013-07-24 (×8): 50 mg via ORAL
  Filled 2013-07-21 (×17): qty 1

## 2013-07-21 MED ORDER — DEXTROSE 5 % IV SOLN
1.0000 g | Freq: Once | INTRAVENOUS | Status: AC
  Administered 2013-07-21: 1 g via INTRAVENOUS
  Filled 2013-07-21: qty 1

## 2013-07-21 MED ORDER — AMLODIPINE BESYLATE 10 MG PO TABS
10.0000 mg | ORAL_TABLET | Freq: Every day | ORAL | Status: DC
  Administered 2013-07-23 – 2013-07-25 (×3): 10 mg via ORAL
  Filled 2013-07-21 (×4): qty 1

## 2013-07-21 MED ORDER — IPRATROPIUM-ALBUTEROL 0.5-2.5 (3) MG/3ML IN SOLN
3.0000 mL | RESPIRATORY_TRACT | Status: DC
  Administered 2013-07-22 (×5): 3 mL via RESPIRATORY_TRACT
  Filled 2013-07-21 (×3): qty 3

## 2013-07-21 MED ORDER — CLONIDINE HCL 0.3 MG PO TABS
0.3000 mg | ORAL_TABLET | Freq: Two times a day (BID) | ORAL | Status: DC
  Administered 2013-07-22 – 2013-07-24 (×4): 0.3 mg via ORAL
  Filled 2013-07-21 (×7): qty 1

## 2013-07-21 MED ORDER — SCOPOLAMINE 1 MG/3DAYS TD PT72
1.0000 | MEDICATED_PATCH | TRANSDERMAL | Status: DC
  Filled 2013-07-21: qty 1

## 2013-07-21 MED ORDER — ONDANSETRON HCL 4 MG/2ML IJ SOLN: 4.0000 mg | Freq: Three times a day (TID) | INTRAMUSCULAR | Status: AC | PRN

## 2013-07-21 MED ORDER — LEVOTHYROXINE SODIUM 75 MCG PO TABS
75.0000 ug | ORAL_TABLET | Freq: Every day | ORAL | Status: DC
  Administered 2013-07-23 – 2013-07-25 (×3): 75 ug via ORAL
  Filled 2013-07-21 (×5): qty 1

## 2013-07-21 MED ORDER — ASPIRIN EC 325 MG PO TBEC
325.0000 mg | DELAYED_RELEASE_TABLET | Freq: Every day | ORAL | Status: DC
  Filled 2013-07-21 (×2): qty 1

## 2013-07-21 MED ORDER — GUAIFENESIN ER 600 MG PO TB12
600.0000 mg | ORAL_TABLET | Freq: Two times a day (BID) | ORAL | Status: DC
  Filled 2013-07-21 (×5): qty 1

## 2013-07-21 MED ORDER — HEPARIN SODIUM (PORCINE) 5000 UNIT/ML IJ SOLN
5000.0000 [IU] | Freq: Three times a day (TID) | INTRAMUSCULAR | Status: DC
  Administered 2013-07-22 – 2013-07-25 (×11): 5000 [IU] via SUBCUTANEOUS
  Filled 2013-07-21 (×14): qty 1

## 2013-07-21 MED ORDER — SODIUM CHLORIDE 0.9 % IV SOLN
INTRAVENOUS | Status: AC
  Administered 2013-07-21: 23:00:00 via INTRAVENOUS

## 2013-07-21 MED ORDER — SODIUM CHLORIDE 0.9 % IV BOLUS (SEPSIS)
1000.0000 mL | Freq: Once | INTRAVENOUS | Status: AC
  Administered 2013-07-21: 1000 mL via INTRAVENOUS

## 2013-07-21 MED ORDER — HYDRALAZINE HCL 20 MG/ML IJ SOLN
10.0000 mg | INTRAMUSCULAR | Status: DC | PRN
  Administered 2013-07-22 (×2): 10 mg via INTRAVENOUS
  Filled 2013-07-21 (×2): qty 1

## 2013-07-21 MED ORDER — VANCOMYCIN HCL 500 MG IV SOLR
500.0000 mg | INTRAVENOUS | Status: DC
  Administered 2013-07-21 – 2013-07-23 (×3): 500 mg via INTRAVENOUS
  Filled 2013-07-21 (×4): qty 500

## 2013-07-21 NOTE — ED Notes (Signed)
Verified with EDP Jeraldine LootsLockwood that blood cultures were not needed before starting antibiotic.

## 2013-07-21 NOTE — ED Notes (Signed)
Per ems, dc from hospital last Saturday for pneumonia/fever,  And today she has pneumonia like symptoms, from home.  Has caregivers.   sp02 in the mid-low 80's.  Mid-week symptoms started get worse.

## 2013-07-21 NOTE — Consult Note (Signed)
I have reviewed and discussed the care of this patient in detail with the nurse practitioner including pertinent patient records, physical exam findings and data. I agree with details of this encounter.  

## 2013-07-21 NOTE — ED Provider Notes (Signed)
CSN: [id number]     Arrival date & time 07/21/13  1701 History   First MD Initiated Contact with Patient 07/21/13 1701     No chief complaint on file.  (Consider location/radiation/quality/duration/timing/severity/associated sxs/prior Treatment) HPI Patient presents from nursing home. History of present illness per the patient's son, a hospice chaplain. Patient has history of dementia, level V caveat. Patient was here 2 weeks ago following a mechanical fall, required surgical repair of femur fracture.  The hospital this was complicated by hospital-acquired pneumonia.  Discharge the patient was sent to her nursing facility on Levaquin. Per report patient has become more listless over the past days with increasing cough, URI like symptoms.  Per report the patient was hypoxic at her nursing facility, with a saturation of 80% on room air. The patient herself is dyspneic, unable to answer questions appropriately on initial evaluation.    Past Medical History  Diagnosis Date  . Hyperlipidemia   . Hypertension   . Arthritis   . Anxiety   . Asthma   . Leg pain     with walking  . Peripheral arterial disease   . Dementia    Past Surgical History  Procedure Laterality Date  . Back surgery    . Femur im nail Right 07/09/2013    Procedure: INTRAMEDULLARY (IM) NAIL FEMORAL;  Surgeon: Javier DockerJeffrey C Beane, MD;  Location: [facility] OR;  Service: Orthopedics;  Laterality: Right;   Family History  Problem Relation Age of Onset  . Heart disease Mother   . Heart disease Brother    History  Substance Use Topics  . Smoking status: Former Smoker    Types: Cigarettes    Quit date: 07/19/1957  . Smokeless tobacco: Not on file  . Alcohol Use: No   OB History   Grav Para Term Preterm Abortions TAB SAB Ect Mult Living                 Review of Systems  Unable to perform ROS: Dementia    Allergies  Celebrex; Epinephrine; Lidocaine; Lyrica; Morphine and related; Sulfa antibiotics; and Ultram  Home  Medications   Current Outpatient Rx  Name  Route  Sig  Dispense  Refill  . ALPRAZolam (XANAX) 0.25 MG tablet   Oral   Take 0.25-0.5 mg by mouth 3 (three) times daily as needed for anxiety.         Marland Kitchen. amLODipine (NORVASC) 10 MG tablet   Oral   Take 1 tablet (10 mg total) by mouth daily.   31 tablet   0   . aspirin EC 325 MG tablet   Oral   Take 1 tablet (325 mg total) by mouth daily.   30 tablet   0   . cloNIDine (CATAPRES) 0.3 MG tablet   Oral   Take 0.3 mg by mouth 2 (two) times daily.         . fluticasone (FLONASE) 50 MCG/ACT nasal spray   Each Nare   Place 1 spray into both nostrils daily as needed for allergies or rhinitis.         Marland Kitchen. guaiFENesin (MUCINEX) 600 MG 12 hr tablet   Oral   Take 1 tablet (600 mg total) by mouth 2 (two) times daily.   12 tablet   0   . hydrALAZINE (APRESOLINE) 50 MG tablet   Oral   Take 1 tablet (50 mg total) by mouth 4 (four) times daily.   360 tablet   4   . ibuprofen (ADVIL,MOTRIN)  200 MG tablet   Oral   Take 200 mg by mouth 3 (three) times daily.         Marland Kitchen levothyroxine (SYNTHROID, LEVOTHROID) 75 MCG tablet   Oral   Take 1 tablet (75 mcg total) by mouth 1 day or 1 dose.   90 tablet   4   . Maltodextrin-Xanthan Gum (RESOURCE THICKENUP CLEAR) POWD   Oral   Take 120 g by mouth as needed (nectar thick liquids.).   10 Can   0   . oxyCODONE-acetaminophen (PERCOCET) 5-325 MG per tablet   Oral   Take 1-2 tablets by mouth every 4 (four) hours as needed.   40 tablet   0   . polyethylene glycol (MIRALAX / GLYCOLAX) packet   Oral   Take 17 g by mouth daily as needed for mild constipation.          BP 148/43  Pulse 97  Temp(Src) 99 F (37.2 C) (Rectal)  Resp 14  SpO2 98% Physical Exam  Nursing note and vitals reviewed. Constitutional: She appears well-developed and well-nourished. She appears listless. She appears cachectic. She appears ill. She appears distressed.  HENT:  Head: Normocephalic and atraumatic.   Eyes: Conjunctivae and EOM are normal.  Cardiovascular: Normal rate and regular rhythm.   Pulmonary/Chest: Accessory muscle usage present. No stridor. Tachypnea noted. She is in respiratory distress. She has decreased breath sounds. She has wheezes.  Abdominal: She exhibits no distension.  Musculoskeletal: She exhibits no edema.  Neurological: She appears listless. She displays atrophy. No cranial nerve deficit.  No asym, diffuse atrophy, she does MAES  Skin: Skin is warm and dry.  Psychiatric: She has a normal mood and affect.    ED Course  Procedures (including critical care time) Labs Review Labs Reviewed  CBC WITH DIFFERENTIAL - Abnormal; Notable for the following:    WBC 16.0 (*)    RBC 3.82 (*)    Hemoglobin 11.1 (*)    HCT 34.6 (*)    RDW 15.6 (*)    Platelets 427 (*)    Neutrophils Relative % 87 (*)    Neutro Abs 13.9 (*)    Lymphocytes Relative 7 (*)    All other components within normal limits  COMPREHENSIVE METABOLIC PANEL  CG4 I-STAT (LACTIC ACID)   Imaging Review No results found.  EKG Interpretation   None     I reviewed the patient's chart.  8:16 PM X-ray consistent with pneumonia.  Patient's labs notable for leukocytosis.  Results shared with the patient's son  MDM  No diagnosis found. Patient presents after recent hospitalization for femur fracture, now with respiratory difficulty.  On exam she is awake, though listless.  The patient's evaluation demonstrates likely hospital acquired pneumonia.  She is DO NOT RESUSCITATE, but received initial antibiotics, was admitted for further evaluation, management.  Patient's son is here with her and voices an understanding of all results, plan of admission    Gerhard Munch, MD 07/21/13 2017

## 2013-07-21 NOTE — Progress Notes (Signed)
ANTIBIOTIC CONSULT NOTE - INITIAL  Pharmacy Consult for vancomycin Indication: rule out pneumonia  Allergies  Allergen Reactions  . Celebrex [Celecoxib]     unknown  . Epinephrine     unsure  . Lidocaine     unsure  . Lyrica [Pregabalin]     Caused her to spit up excessively and became dehydrated  . Morphine And Related Other (See Comments)    hallucinations  . Sulfa Antibiotics     unsure  . Ultram [Tramadol Hcl] Other (See Comments)    Spit up and become dehydrated    Patient Measurements:   Adjusted Body Weight:   Vital Signs: Temp: 99 F (37.2 C) (01/03 1757) Temp src: Rectal (01/03 1757) BP: 164/49 mmHg (01/03 1845) Pulse Rate: 76 (01/03 1845) Intake/Output from previous day:   Intake/Output from this shift:    Labs:  Recent Labs  07/21/13 1815  WBC 16.0*  HGB 11.1*  PLT 427*  CREATININE 1.10   The CrCl is unknown because both a height and weight (above a minimum accepted value) are required for this calculation. No results found for this basename: VANCOTROUGH, Leodis Binet, VANCORANDOM, GENTTROUGH, GENTPEAK, GENTRANDOM, TOBRATROUGH, TOBRAPEAK, TOBRARND, AMIKACINPEAK, AMIKACINTROU, AMIKACIN,  in the last 72 hours   Microbiology: Recent Results (from the past 720 hour(s))  SURGICAL PCR SCREEN     Status: Abnormal   Collection Time    07/09/13  4:04 PM      Result Value Range Status   MRSA, PCR POSITIVE (*) NEGATIVE Final   Staphylococcus aureus POSITIVE (*) NEGATIVE Final   Comment:            The Xpert SA Assay (FDA     approved for NASAL specimens     in patients over 18 years of age),     is one component of     a comprehensive surveillance     program.  Test performance has     been validated by [employer] for patients greater     than or equal to 29 year old.     It is not intended     to diagnose infection nor to     guide or monitor treatment.  URINE CULTURE     Status: None   Collection Time    07/12/13 11:05 AM      Result  Value Range Status   Specimen Description URINE, CATHETERIZED   Final   Special Requests NONE   Final   Culture  Setup Time     Final   Value: 07/12/2013 12:00     Performed at [facility] Count     Final   Value: NO GROWTH     Performed at [facility]   Culture     Final   Value: NO GROWTH     Performed at [facility]   Report Status 07/13/2013 FINAL   Final    Medical History: Past Medical History  Diagnosis Date  . Hyperlipidemia   . Hypertension   . Arthritis   . Anxiety   . Asthma   . Leg pain     with walking  . Peripheral arterial disease   . Dementia     Medications:  Anti-infectives   Start     Dose/Rate Route Frequency Ordered Stop   07/21/13 2100  vancomycin (VANCOCIN) 500 mg in sodium chloride 0.9 % 100 mL IVPB     500 mg 100  mL/hr over 60 Minutes Intravenous Every 24 hours 07/21/13 2029     07/21/13 2030  ceFEPIme (MAXIPIME) 1 g in dextrose 5 % 50 mL IVPB     1 g 100 mL/hr over 30 Minutes Intravenous  Once 07/21/13 2016       Assessment: [age] yof presented with URI and AMS after recent discharge for mechanical fall that required surgical repair of a femur fracture. Pt is afebrile and WBC is WNL. Scr is 1.1 but pt is small and elderly so estimated CrCl is only ~4120ml/min. To start empiric vancomycin. ED MD also ordered 1x dose of cefepime.   Goal of Therapy:  Vancomycin trough level 15-20 mcg/ml  Plan:  1. Vancomycin 500mg  IV Q24H 2. F/u renal fxn, C&S, clinical status and trough at SS 3. F/u plans to continue cefepime  Ansley Stanwood, Drake LeachRachel Lynn 07/21/2013,8:29 PM

## 2013-07-21 NOTE — H&P (Addendum)
[facility] History and Physical  Patient: [name]  EAV:[id number]  DOB: 1919/03/10  DOS: the patient was seen and examined on 07/21/2013 PCP: Bufford SpikesEED, TIFFANY, DO  Chief Complaint: cough and Shortness of breath with confusion  HPI: [name] is a 80 y.o. female with Past medical history of hypertension, dyslipidemia, peripheral vascular disease, dementia, recent hip fracture repair and recent pneumonia. The patient is coming from home. The patient lives with a caregiver at her group home. History has been updated from son as the patient appears poor historian due to her confusion and dementia. Since she has been discharged she has been having some cough which has gotten worse and lasting 4 days associated with shortness of breath. He also has noted that since last few days her mentation has been progressively worse and she is more lethargy. He denies any knowledge of vomiting or diarrhea or signs of aspiration. The patient has been working with physical therapy after the discharge at home. The son was notified that her oxygen saturations were in the 80s with physical therapy yesterday. Son mentions that after the discharge she has not received any Xanax and she has received only one dose of OxyIR 5 mg. Otherwise her pain is well controlled on its own. Prior to her hip fracture she has been mobile with walker on her own. Son was also notified that yesterday the caregiver noted sacral decubitus lesion. Patient has completed course of Levaquin, so mentions that they have been giving her dysphagia type III diet  Review of Systems: as mentioned in the history of present illness.  A Comprehensive review of the other systems is negative.  Past Medical History  Diagnosis Date  . Hyperlipidemia   . Hypertension   . Arthritis   . Anxiety   . Asthma   . Leg pain     with walking  . Peripheral arterial disease   . Dementia    Past Surgical History  Procedure Laterality Date  . Back  surgery    . Femur im nail Right 07/09/2013    Procedure: INTRAMEDULLARY (IM) NAIL FEMORAL;  Surgeon: Javier DockerJeffrey C Beane, MD;  Location: [facility] OR;  Service: Orthopedics;  Laterality: Right;   Social History:  reports that she quit smoking about 56 years ago. Her smoking use included Cigarettes. She smoked 0.00 packs per day. She does not have any smokeless tobacco history on file. She reports that she does not drink alcohol or use illicit drugs. Independent for most of her  ADL.  Allergies  Allergen Reactions  . Celebrex [Celecoxib]     unknown  . Epinephrine     unsure  . Lidocaine     unsure  . Lyrica [Pregabalin]     Caused her to spit up excessively and became dehydrated  . Morphine And Related Other (See Comments)    hallucinations  . Sulfa Antibiotics     unsure  . Ultram [Tramadol Hcl] Other (See Comments)    Spit up and become dehydrated    Family History  Problem Relation Age of Onset  . Heart disease Mother   . Heart disease Brother     Prior to Admission medications   Medication Sig Start Date End Date Taking? Authorizing Provider  ALPRAZolam (XANAX) 0.25 MG tablet Take 0.25-0.5 mg by mouth 3 (three) times daily as needed for anxiety.   Yes Historical Provider, MD  amLODipine (NORVASC) 10 MG tablet Take 1 tablet (10 mg total) by mouth daily. 07/14/13  Yes  Rodolph Bong, MD  aspirin EC 325 MG tablet Take 1 tablet (325 mg total) by mouth daily. 07/09/13  Yes Javier Docker, MD  cloNIDine (CATAPRES) 0.3 MG tablet Take 0.3 mg by mouth 2 (two) times daily.   Yes Historical Provider, MD  fluticasone (FLONASE) 50 MCG/ACT nasal spray Place 1 spray into both nostrils daily as needed for allergies or rhinitis.   Yes Historical Provider, MD  guaiFENesin (MUCINEX) 600 MG 12 hr tablet Take 1 tablet (600 mg total) by mouth 2 (two) times daily. 07/14/13  Yes Rodolph Bong, MD  hydrALAZINE (APRESOLINE) 50 MG tablet Take 1 tablet (50 mg total) by mouth 4 (four) times daily.  05/28/13  Yes Tiffany L Reed, DO  levothyroxine (SYNTHROID, LEVOTHROID) 75 MCG tablet Take 1 tablet (75 mcg total) by mouth 1 day or 1 dose. 05/28/13  Yes Tiffany L Reed, DO  Maltodextrin-Xanthan Gum (RESOURCE THICKENUP CLEAR) POWD Take 120 g by mouth as needed (nectar thick liquids.). 07/14/13  Yes Rodolph Bong, MD  oxyCODONE-acetaminophen (PERCOCET) 5-325 MG per tablet Take 1-2 tablets by mouth every 4 (four) hours as needed. 07/09/13  Yes Javier Docker, MD  polyethylene glycol (MIRALAX / GLYCOLAX) packet Take 17 g by mouth daily as needed for mild constipation.   Yes Historical Provider, MD    Physical Exam: Filed Vitals:   07/21/13 1845 07/21/13 1936 07/21/13 2015 07/21/13 2045  BP: 164/49  143/47 172/51  Pulse: 76  86 85  Temp:      TempSrc:      Resp: 21  21 12   SpO2: 100% 96% 99% 95%    General: Alert, Awake and Appear in moderate distress Eyes: PERRL ENT: Oral Mucosa clear moist. Neck: No JVD Cardiovascular: S1 and S2 Present, aortic systolic Murmur, Peripheral Pulses Present Respiratory: Bilateral Air entry equal and Decreased, extensive rhonchi and Crackles, expiratory wheezes Abdomen: Bowel Sound Present, Soft and suprapubic tender no guarding or rigidity Skin: No Rash, decubitus ulcers on elbow and sacrum Extremities: No Pedal edema, no calf tenderness Neurologic: Grossly Unremarkable.  Labs on Admission:  CBC:  Recent Labs Lab 07/21/13 1815  WBC 16.0*  NEUTROABS 13.9*  HGB 11.1*  HCT 34.6*  MCV 90.6  PLT 427*    CMP     Component Value Date/Time   NA 148* 07/21/2013 1815   K 3.6* 07/21/2013 1815   CL 110 07/21/2013 1815   CO2 26 07/21/2013 1815   GLUCOSE 113* 07/21/2013 1815   BUN 22 07/21/2013 1815   CREATININE 1.10 07/21/2013 1815   CALCIUM 8.4 07/21/2013 1815   PROT 6.3 07/21/2013 1815   ALBUMIN 2.3* 07/21/2013 1815   AST 20 07/21/2013 1815   ALT 10 07/21/2013 1815   ALKPHOS 95 07/21/2013 1815   BILITOT 0.7 07/21/2013 1815   GFRNONAA 42* 07/21/2013 1815   GFRAA  49* 07/21/2013 1815    No results found for this basename: LIPASE, AMYLASE,  in the last 168 hours No results found for this basename: AMMONIA,  in the last 168 hours  No results found for this basename: CKTOTAL, CKMB, CKMBINDEX, TROPONINI,  in the last 168 hours BNP (last 3 results) No results found for this basename: PROBNP,  in the last 8760 hours  Radiological Exams on Admission: Dg Chest Portable 1 View  07/21/2013   CLINICAL DATA:  Shortness of breath.  EXAM: PORTABLE CHEST - 1 VIEW  COMPARISON:  Chest x-ray 07/12/2013.  FINDINGS: Bibasilar opacities may reflect areas of atelectasis and/or  consolidation, likely with superimposed small bilateral pleural effusions. No evidence of pulmonary edema. Heart size appears borderline enlarged. The patient is rotated to the left on today's exam, resulting in distortion of the mediastinal contours and reduced diagnostic sensitivity and specificity for mediastinal pathology. Atherosclerosis in the thoracic aorta.  IMPRESSION: 1. New bibasilar opacities may reflect areas of atelectasis and/or consolidation, with superimposed small bilateral pleural effusions. 2. Atherosclerosis.   Electronically Signed   By: Trudie Reed M.D.   On: 07/21/2013 20:00    Assessment/Plan Active Problems:   Hyperlipidemia   Hypertension   Alzheimer's dementia   Asthma   Closed right hip fracture   Weakness generalized   HCAP (healthcare-associated pneumonia)   1. HCAP (healthcare-associated pneumonia) The patient is presenting with complaints of cough and shortness of breath. Her chest x-ray is showing new signs of obesity suggesting possible constipation. She is hypoxic and requiring 3 L of oxygen to maintain adequate saturation. She does not use any oxygen at her baseline. At present her I suspect that her primary issue is secretion clearance which is not able to do due to her lethargy. I will treat her for healthcare associated pneumonia under telemetry. Will  give her IV vancomycin and cefepime. I will obtain blood cultures urinalysis and urine antigens. We'll also obtain sputum culture. I would continue her on DuoNeb and Mucinex. I will give her a flutter valve to help secretion clearance. Blood gas analysis to check hypercarbia.  2. Altered mental status Her acute encephalopathy is likely secondary to hypoxia. I will get a blood gas and TSH level. We'll also get a urinalysis. I would avoid Xanax and pain medications at present.  3. Closed right hip fracture Consulting PTOT  4. Accelerated hypertension As per the son her blood pressure has always been difficult to maintain I would continue her on clonidine, amlodipine, put her on when necessary hydralazine  5. Alzheimer's dementia Continue to monitor for agitation.  DVT Prophylaxis: subcutaneous Heparin Nutrition: N.p.o. until speech evaluation is done  Code Status: DNR/DNI  Family Communication: Son was present at bedside, opportunity was given to ask question and all questions were answered satisfactorily at the time of interview. Disposition: Admitted to inpatient in telemetry unit.  Author: Lynden Oxford, MD [facility] Pager: [phone] 07/21/2013, 9:44 PM    If 7PM-7AM, please contact night-coverage [email or web address] Password [email or web address]

## 2013-07-21 NOTE — ED Notes (Signed)
Patient recently discharged after having surgery for a femur fx.

## 2013-07-22 DIAGNOSIS — E870 Hyperosmolality and hypernatremia: Secondary | ICD-10-CM

## 2013-07-22 DIAGNOSIS — I10 Essential (primary) hypertension: Secondary | ICD-10-CM

## 2013-07-22 LAB — STREP PNEUMONIAE URINARY ANTIGEN: Strep Pneumo Urinary Antigen: NEGATIVE

## 2013-07-22 LAB — COMPREHENSIVE METABOLIC PANEL
ALK PHOS: 93 U/L (ref 39–117)
ALT: 10 U/L (ref 0–35)
AST: 18 U/L (ref 0–37)
Albumin: 2.2 g/dL — ABNORMAL LOW (ref 3.5–5.2)
BUN: 21 mg/dL (ref 6–23)
CO2: 20 meq/L (ref 19–32)
Calcium: 7.9 mg/dL — ABNORMAL LOW (ref 8.4–10.5)
Chloride: 114 mEq/L — ABNORMAL HIGH (ref 96–112)
Creatinine, Ser: 0.96 mg/dL (ref 0.50–1.10)
GFR calc non Af Amer: 50 mL/min — ABNORMAL LOW (ref >90)
GFR, EST AFRICAN AMERICAN: 58 mL/min — AB (ref >90)
Glucose, Bld: 116 mg/dL — ABNORMAL HIGH (ref 70–99)
Potassium: 3.3 mEq/L — ABNORMAL LOW (ref 3.7–5.3)
Sodium: 150 mEq/L — ABNORMAL HIGH (ref 137–147)
Total Bilirubin: 0.7 mg/dL (ref 0.3–1.2)
Total Protein: 6.1 g/dL (ref 6.0–8.3)

## 2013-07-22 LAB — CBC WITH DIFFERENTIAL/PLATELET
BASOS ABS: 0 10*3/uL (ref 0.0–0.1)
Basophils Relative: 0 % (ref 0–1)
EOS PCT: 0 % (ref 0–5)
Eosinophils Absolute: 0 10*3/uL (ref 0.0–0.7)
HCT: 33 % — ABNORMAL LOW (ref 36.0–46.0)
Hemoglobin: 10.9 g/dL — ABNORMAL LOW (ref 12.0–15.0)
LYMPHS PCT: 6 % — AB (ref 12–46)
Lymphs Abs: 1 10*3/uL (ref 0.7–4.0)
MCH: 29.1 pg (ref 26.0–34.0)
MCHC: 33 g/dL (ref 30.0–36.0)
MCV: 88 fL (ref 78.0–100.0)
MONOS PCT: 5 % (ref 3–12)
Monocytes Absolute: 0.9 10*3/uL (ref 0.1–1.0)
NEUTROS ABS: 15.3 10*3/uL — AB (ref 1.7–7.7)
Neutrophils Relative %: 89 % — ABNORMAL HIGH (ref 43–77)
Platelets: 338 10*3/uL (ref 150–400)
RBC: 3.75 MIL/uL — ABNORMAL LOW (ref 3.87–5.11)
RDW: 15.4 % (ref 11.5–15.5)
WBC Morphology: INCREASED
WBC: 17.2 10*3/uL — AB (ref 4.0–10.5)

## 2013-07-22 LAB — INFLUENZA PANEL BY PCR (TYPE A & B)
H1N1 flu by pcr: NOT DETECTED
Influenza A By PCR: POSITIVE — AB
Influenza B By PCR: NEGATIVE

## 2013-07-22 MED ORDER — MUPIROCIN 2 % EX OINT
1.0000 "application " | TOPICAL_OINTMENT | Freq: Two times a day (BID) | CUTANEOUS | Status: DC
  Filled 2013-07-22: qty 22

## 2013-07-22 MED ORDER — OSELTAMIVIR PHOSPHATE 30 MG PO CAPS
30.0000 mg | ORAL_CAPSULE | Freq: Every day | ORAL | Status: DC
  Administered 2013-07-22 – 2013-07-25 (×4): 30 mg via ORAL
  Filled 2013-07-22 (×4): qty 1

## 2013-07-22 MED ORDER — IPRATROPIUM-ALBUTEROL 0.5-2.5 (3) MG/3ML IN SOLN
3.0000 mL | Freq: Three times a day (TID) | RESPIRATORY_TRACT | Status: DC
  Administered 2013-07-23 – 2013-07-25 (×6): 3 mL via RESPIRATORY_TRACT
  Filled 2013-07-22 (×7): qty 3

## 2013-07-22 MED ORDER — OSELTAMIVIR PHOSPHATE 75 MG PO CAPS: 75.0000 mg | ORAL_CAPSULE | Freq: Two times a day (BID) | ORAL | Status: DC

## 2013-07-22 MED ORDER — CHLORHEXIDINE GLUCONATE CLOTH 2 % EX PADS: 6.0000 | MEDICATED_PAD | Freq: Every day | CUTANEOUS | Status: DC

## 2013-07-22 MED ORDER — DEXTROSE-NACL 5-0.45 % IV SOLN
INTRAVENOUS | Status: DC
  Administered 2013-07-22 – 2013-07-24 (×4): via INTRAVENOUS

## 2013-07-22 MED ORDER — BIOTENE DRY MOUTH MT LIQD
15.0000 mL | Freq: Two times a day (BID) | OROMUCOSAL | Status: DC
  Administered 2013-07-22 – 2013-07-25 (×6): 15 mL via OROMUCOSAL

## 2013-07-22 MED ORDER — ALBUTEROL SULFATE (2.5 MG/3ML) 0.083% IN NEBU: 2.5000 mg | INHALATION_SOLUTION | Freq: Four times a day (QID) | RESPIRATORY_TRACT | Status: DC | PRN

## 2013-07-22 NOTE — Progress Notes (Signed)
Patient spit out Mucinex, unable to crush medication.

## 2013-07-22 NOTE — Evaluation (Signed)
Physical Therapy Evaluation Patient Details Name: [name] MRN: [id number] DOB: 06-Jun-1919 Today's Date: 07/22/2013 Time: 4098-1191 PT Time Calculation (min): 24 min  PT Assessment / Plan / Recommendation History of Present Illness  Pt is a 80 y.o. female adm with HCAP. pt with recent hospitalization due to fall and was s/p IM femoral nail.   Clinical Impression  Pt adm due to the above. Presents with decreased independence with functional mobility secondary to overall deconditioning, cognitive deficits and PWB status. Pt to benefit from skilled PT in acute setting to maximize functional mobility and safety with transfers. Pt will require 24/7 total (A). No family present to determine (A) available. Will recommend SNF at this time. If family is agreeable to providing total (A) for pt; will recommend HHPT upon acute D/C.      PT Assessment  Patient needs continued PT services    Follow Up Recommendations  SNF;Supervision/Assistance - 24 hour;Other (comment) (if family is unable to provide 24/7 total (A) )    Does the patient have the potential to tolerate intense rehabilitation      Barriers to Discharge Decreased caregiver support no family present; all home setup and PLOF are from most recent hospitalization     Equipment Recommendations  None recommended by PT    Recommendations for Other Services OT consult   Frequency Min 2X/week    Precautions / Restrictions Precautions Precautions: Fall Restrictions Weight Bearing Restrictions: Yes RLE Weight Bearing: Partial weight bearing RLE Partial Weight Bearing Percentage or Pounds: 50%   Pertinent Vitals/Pain Per RN HR reached 140 during activity. patient repositioned for comfort in chair       Mobility  Bed Mobility Bed Mobility: Supine to Sit;Sitting - Scoot to Edge of Bed Supine to Sit: 1: +1 Total assist;HOB elevated Supine to Sit: Patient Percentage: 10% Sitting - Scoot to Edge of Bed: 1: +1 Total assist Sitting -  Scoot to Edge of Bed: Patient Percentage: 0% Details for Bed Mobility Assistance: use of bed pad to (A) pt to sitting position on EOB; pt able to minimally initiate movement of LEs to EOB; max cues for hand placement and cues; pt with difficulty following commands  Transfers Transfers: Sit to Stand;Stand to Sit;Stand Pivot Transfers Sit to Stand: 1: +1 Total assist;From bed;With upper extremity assist Stand to Sit: 1: +1 Total assist;To chair/3-in-1;With upper extremity assist;With armrests Stand Pivot Transfers: 1: +1 Total assist;From elevated surface Details for Transfer Assistance: pt unable to follow commands for safety and hand placement; attempted to stand on her own; required total (A) to maintain WB status and facilitate pivotal steps to chair; pt desat on 2L to 87%.  Ambulation/Gait Ambulation/Gait Assistance: Not tested (comment) Ambulation/Gait Assistance Details: unable to ambulate at this time  Stairs: No Wheelchair Mobility Wheelchair Mobility: No         PT Diagnosis: Difficulty walking;Acute pain;Generalized weakness  PT Problem List: Decreased strength;Decreased range of motion;Decreased activity tolerance;Decreased balance;Decreased mobility;Decreased cognition;Decreased knowledge of use of DME;Decreased safety awareness;Decreased knowledge of precautions;Pain PT Treatment Interventions: DME instruction;Gait training;Functional mobility training;Therapeutic activities;Therapeutic exercise;Neuromuscular re-education;Balance training;Patient/family education;Wheelchair mobility training     PT Goals(Current goals can be found in the care plan section) Acute Rehab PT Goals Patient Stated Goal: Pt unable to state a goal at this time PT Goal Formulation: With patient Time For Goal Achievement: 08/05/13 Potential to Achieve Goals: Fair  Visit Information  Last PT Received On: 07/22/13 Assistance Needed: +2 History of Present Illness: Pt is a 80 y.o.  female adm with HCAP.  pt with recent hospitalization due to fall and was s/p IM femoral nail.        Prior Functioning  Home Living Family/patient expects to be discharged to:: Private residence Living Arrangements: Children;Other (Comment) (per RN; no family present ) Available Help at Discharge: Personal care attendant;Available 24 hours/day Type of Home: House Home Access: Stairs to enter Entergy CorporationEntrance Stairs-Number of Steps: 2 Home Equipment: Walker - 2 wheels;Bedside commode Prior Function Level of Independence: Needs assistance Comments: no family present to determine PLOF; per chart pt was total (A) upon last D/C  Communication Communication: No difficulties Dominant Hand: Right    Cognition  Cognition Arousal/Alertness: Awake/alert Behavior During Therapy: WFL for tasks assessed/performed Overall Cognitive Status: History of cognitive impairments - at baseline Memory: Decreased short-term memory;Decreased recall of precautions    Extremity/Trunk Assessment Upper Extremity Assessment Upper Extremity Assessment: Defer to OT evaluation Lower Extremity Assessment Lower Extremity Assessment: Generalized weakness;Difficult to assess due to impaired cognition (Rt LE pain ) RLE Deficits / Details: limited to Rt LE pain and congition  RLE: Unable to fully assess due to pain Cervical / Trunk Assessment Cervical / Trunk Assessment: Kyphotic   Balance Balance Balance Assessed: Yes Static Sitting Balance Static Sitting - Balance Support: Feet supported;Bilateral upper extremity supported Static Sitting - Level of Assistance: 5: Stand by assistance Static Sitting - Comment/# of Minutes: pt tolerated sitting EOB with minimal tactile cues for ~7 min prior to transfer; HR at 102 prior to transfer  End of Session PT - End of Session Equipment Utilized During Treatment: Gait belt;Oxygen (2L) Activity Tolerance: Patient tolerated treatment well Patient left: in chair;with call bell/phone within reach;with chair  alarm set Nurse Communication: Mobility status;Precautions;Weight bearing status  GP     [name], [name], [place]PT [phone] 07/22/2013, 12:54 PM

## 2013-07-22 NOTE — Progress Notes (Addendum)
PATIENT DETAILS Name: [name] Age: 80 y.o. Sex: female Date of Birth: 1919-07-06 Admit Date: 07/21/2013 Admitting Physician Lynden Oxford, MD ZOX:WRUE, TIFFANY, DO  Subjective: Lethargic but awake, sitting at bedisde  Assessment/Plan: Principal Problem:   HCAP (healthcare-associated pneumonia) -afebrile, still with leukocytosis - Suspect aspiration, await speech therapy eval -c/w Empiric Vanco/Zosyn -Blood culture 1/4 negative  Addendum  - Influenza A positive-starting Tamiflu   Encephalopathy - Secondary to above - Better with IV antibiotics  Recent hip fracture - Continue PT/OT  Hypernatremia - Secondary to poor oral intake - Change IV fluids to half normal  Hypertension - Continue clonidine and amlodipine - Resume hydralazine in the next 1-2 days  Hypothyroidism - Continue levothyroxin  Alzheimer's dementia - Watch out for delirium  Disposition: Remain inpatient  DVT Prophylaxis: Prophylactic Heparin   Code Status: DNR  Family Communication Son-Phillip Hoffman- over the phone  Procedures:  None  CONSULTS:  None  Time spent 40 minutes-which includes 50% of the time with face-to-face with patient and family.   MEDICATIONS: Scheduled Meds: . amLODipine  10 mg Oral Daily  . antiseptic oral rinse  15 mL Mouth Rinse BID  . aspirin EC  325 mg Oral Daily  . ceFEPime (MAXIPIME) IV  1 g Intravenous Q24H  . [START ON 07/23/2013] Chlorhexidine Gluconate Cloth  6 each Topical Q0600  . cloNIDine  0.3 mg Oral BID  . guaiFENesin  600 mg Oral BID  . heparin  5,000 Units Subcutaneous Q8H  . hydrALAZINE  50 mg Oral QID  . ipratropium-albuterol  3 mL Nebulization Q4H  . levothyroxine  75 mcg Oral QAC breakfast  . mupirocin ointment  1 application Nasal BID  . vancomycin  500 mg Intravenous Q24H   Continuous Infusions: . dextrose 5 % and 0.45% NaCl     PRN Meds:.hydrALAZINE  Antibiotics: Anti-infectives   Start     Dose/Rate Route  Frequency Ordered Stop   07/22/13 2000  ceFEPIme (MAXIPIME) 1 g in dextrose 5 % 50 mL IVPB     1 g 100 mL/hr over 30 Minutes Intravenous Every 24 hours 07/21/13 2230 07/30/13 1959   07/22/13 0600  ceFEPIme (MAXIPIME) 1 g in dextrose 5 % 50 mL IVPB  Status:  Discontinued     1 g 100 mL/hr over 30 Minutes Intravenous 3 times per day 07/21/13 2216 07/21/13 2229   07/21/13 2100  vancomycin (VANCOCIN) 500 mg in sodium chloride 0.9 % 100 mL IVPB     500 mg 100 mL/hr over 60 Minutes Intravenous Every 24 hours 07/21/13 2029     07/21/13 2030  ceFEPIme (MAXIPIME) 1 g in dextrose 5 % 50 mL IVPB     1 g 100 mL/hr over 30 Minutes Intravenous  Once 07/21/13 2016 07/21/13 2103       PHYSICAL EXAM: Vital signs in last 24 hours: Filed Vitals:   07/22/13 0924 07/22/13 1220 07/22/13 1406 07/22/13 1600  BP:   145/90   Pulse:   89   Temp:   98.2 F (36.8 C)   TempSrc:   Oral   Resp:   18   Height:      Weight:      SpO2: 94% 97% 96% 96%    Weight change:  Filed Weights   07/21/13 2225  Weight: 42.9 kg (94 lb 9.2 oz)   Body mass index is 16.76 kg/(m^2).   Gen Exam: Awake, but lethargic, answers simple questions  Neck: Supple, No JVD.   Chest:  Good air entry-course by basilar rales.   CVS: S1 S2 Regular Abdomen: soft, BS +, non tender, non distended.  Extremities: no edema, lower extremities warm to touch. Neurologic: Non Focal- moves all 4 extremities Skin: No Rash.   Wounds: N/A.    Intake/Output from previous day:  Intake/Output Summary (Last 24 hours) at 07/22/13 1608 Last data filed at 07/22/13 1435  Gross per 24 hour  Intake 396.67 ml  Output    200 ml  Net 196.67 ml     LAB RESULTS: CBC  Recent Labs Lab 07/21/13 1815 07/22/13 0700  WBC 16.0* 17.2*  HGB 11.1* 10.9*  HCT 34.6* 33.0*  PLT 427* 338  MCV 90.6 88.0  MCH 29.1 29.1  MCHC 32.1 33.0  RDW 15.6* 15.4  LYMPHSABS 1.1 1.0  MONOABS 1.0 0.9  EOSABS 0.0 0.0  BASOSABS 0.0 0.0    Chemistries   Recent  Labs Lab 07/21/13 1815 07/22/13 0700  NA 148* 150*  K 3.6* 3.3*  CL 110 114*  CO2 26 20  GLUCOSE 113* 116*  BUN 22 21  CREATININE 1.10 0.96  CALCIUM 8.4 7.9*    CBG: No results found for this basename: GLUCAP,  in the last 168 hours  GFR Estimated Creatinine Clearance: 25.3 ml/min (by C-G formula based on Cr of 0.96).  Coagulation profile No results found for this basename: INR, PROTIME,  in the last 168 hours  Cardiac Enzymes No results found for this basename: CK, CKMB, TROPONINI, MYOGLOBIN,  in the last 168 hours  No components found with this basename: POCBNP,  No results found for this basename: DDIMER,  in the last 72 hours No results found for this basename: HGBA1C,  in the last 72 hours No results found for this basename: CHOL, HDL, LDLCALC, TRIG, CHOLHDL, LDLDIRECT,  in the last 72 hours No results found for this basename: TSH, T4TOTAL, FREET3, T3FREE, THYROIDAB,  in the last 72 hours No results found for this basename: VITAMINB12, FOLATE, FERRITIN, TIBC, IRON, RETICCTPCT,  in the last 72 hours No results found for this basename: LIPASE, AMYLASE,  in the last 72 hours  Urine Studies No results found for this basename: UACOL, UAPR, USPG, UPH, UTP, UGL, UKET, UBIL, UHGB, UNIT, UROB, ULEU, UEPI, UWBC, URBC, UBAC, CAST, CRYS, UCOM, BILUA,  in the last 72 hours  MICROBIOLOGY: No results found for this or any previous visit (from the past 240 hour(s)).  RADIOLOGY STUDIES/RESULTS: Dg Hip Complete Right  07/09/2013   CLINICAL DATA:  Right hip fracture.  EXAM: RIGHT HIP - COMPLETE 2+ VIEW  COMPARISON:  None.  FINDINGS: Acute intertrochanteric fracture of the proximal right femur present with displacement, angulation and impaction. No dislocation identified. No other pelvic fractures are seen.  IMPRESSION: Acute intertrochanteric right hip fracture.   Electronically Signed   By: Irish Lack M.D.   On: 07/09/2013 13:00   Dg Hip Operative Right  07/09/2013   CLINICAL  DATA:  ORIF of the right hip.  EXAM: DG OPERATIVE RIGHT HIP  TECHNIQUE: A single spot fluoroscopic AP image of the right hip is submitted.  COMPARISON:  None.  FINDINGS: Eight fluoroscopic spot films demonstrate gamma nail placement with short intra medullary nail. The IM nail has a distal interlocking screw.  IMPRESSION: Gamma nail fixation of the right intertrochanteric femur fracture.   Electronically Signed   By: Andreas Newport M.D.   On: 07/09/2013 20:24   Dg Pelvis Portable  07/09/2013   CLINICAL DATA:  Postop ORIF of  right hip fracture  EXAM: PORTABLE PELVIS 1-2 VIEWS  COMPARISON:  AP pelvis radiographs-earlier same day  FINDINGS: Provided image demonstrates the sequela of intra medullary rod fixation of the proximal femur and dynamic screw fixation of the right femoral neck. Note, the caudal end of the right femoral intra medullary rod is excluded from view.  There is a persistently displaced avulsion fracture of the right lesser trochanter. Otherwise, there is near anatomic alignment given obliquity.  There are expected foci of subcutaneous emphysema about the operative site. Adjacent skin staples. Vascular calcifications.  IMPRESSION: Post ORIF of the right hip and femur without definite evidence of complication.   Electronically Signed   By: Simonne ComeJohn  Watts M.D.   On: 07/09/2013 21:31   Dg Pelvis Portable  07/09/2013   CLINICAL DATA:  Fall. Obvious right hip deformity at physical examination.  EXAM: PORTABLE PELVIS 1-2 VIEWS  COMPARISON:  None.  FINDINGS: Comminuted intertrochanteric right femoral neck fracture. Right hip joint anatomically aligned with marked joint space narrowing. Remainder of the pelvis not optimally imaged.  IMPRESSION: Comminuted intertrochanteric right femoral neck fracture.   Electronically Signed   By: Hulan Saashomas  Lawrence M.D.   On: 07/09/2013 10:30   Dg Chest Portable 1 View  07/21/2013   CLINICAL DATA:  Shortness of breath.  EXAM: PORTABLE CHEST - 1 VIEW  COMPARISON:  Chest  x-ray 07/12/2013.  FINDINGS: Bibasilar opacities may reflect areas of atelectasis and/or consolidation, likely with superimposed small bilateral pleural effusions. No evidence of pulmonary edema. Heart size appears borderline enlarged. The patient is rotated to the left on today's exam, resulting in distortion of the mediastinal contours and reduced diagnostic sensitivity and specificity for mediastinal pathology. Atherosclerosis in the thoracic aorta.  IMPRESSION: 1. New bibasilar opacities may reflect areas of atelectasis and/or consolidation, with superimposed small bilateral pleural effusions. 2. Atherosclerosis.   Electronically Signed   By: Trudie Reedaniel  Entrikin M.D.   On: 07/21/2013 20:00   Dg Chest Port 1 View  07/12/2013   CLINICAL DATA:  Cough  EXAM: PORTABLE CHEST - 1 VIEW  COMPARISON:  07/09/2013  FINDINGS: Mild cardiomegaly without CHF or edema. Mild hyperinflation, suspect COPD/emphysema. Right lung clear. Left lower lobe retrocardiac consolidation noted, pneumonia not excluded. Question small left effusion. No pneumothorax. Degenerative changes of the spine with an associated scoliosis. Atherosclerosis of the aorta present.  IMPRESSION: Developing left lower lobe retrocardiac consolidation concerning for pneumonia.   Electronically Signed   By: Ruel Favorsrevor  Shick M.D.   On: 07/12/2013 12:18   Dg Chest Portable 1 View  07/09/2013   CLINICAL DATA:  Fall  EXAM: PORTABLE CHEST - 1 VIEW  COMPARISON:  05/20/2012  FINDINGS: Cardiomediastinal silhouette is stable. Hyperinflation. Mild degenerative changes thoracic spine. Atherosclerotic calcifications of thoracic aorta. No acute infiltrate or pulmonary edema.  IMPRESSION: No active disease.  Hyperinflation.   Electronically Signed   By: Natasha MeadLiviu  Pop M.D.   On: 07/09/2013 11:20    Jeoffrey MassedGHIMIRE,SHANKER, MD  [facility] Pager:[phone]  If 7PM-7AM, please contact night-coverage [email or web address] Password [email or web address] 07/22/2013, 4:08 PM   LOS: 1 day

## 2013-07-22 NOTE — Progress Notes (Signed)
Two RN's attempted I &O cath.  Foley placed to retreive urine. Patient covert to Afib HR 130's during catheter placements. Patient converted back to NSR.  Spoke with Benedetto Coons. Callahan, OK to leave foley catheter in.  Will continue to monitor.

## 2013-07-22 NOTE — Progress Notes (Signed)
Unable to complete psychosocial/suicide assessment d/t dementia and oriented to self only.

## 2013-07-22 NOTE — Progress Notes (Signed)
Patient has not urinated since 3 pm per patient's son.  Bladder scan shows 800 mL.  Spoke with Benedetto Coons. Callahan, NP. Will do I & O cath per MD order.  Will continue to monitor.

## 2013-07-23 DIAGNOSIS — J09X2 Influenza due to identified novel influenza A virus with other respiratory manifestations: Secondary | ICD-10-CM

## 2013-07-23 DIAGNOSIS — E876 Hypokalemia: Secondary | ICD-10-CM

## 2013-07-23 LAB — BASIC METABOLIC PANEL
BUN: 21 mg/dL (ref 6–23)
CHLORIDE: 114 meq/L — AB (ref 96–112)
CO2: 21 meq/L (ref 19–32)
Calcium: 8.2 mg/dL — ABNORMAL LOW (ref 8.4–10.5)
Creatinine, Ser: 0.84 mg/dL (ref 0.50–1.10)
GFR calc Af Amer: 68 mL/min — ABNORMAL LOW (ref >90)
GFR calc non Af Amer: 59 mL/min — ABNORMAL LOW (ref >90)
Glucose, Bld: 154 mg/dL — ABNORMAL HIGH (ref 70–99)
Potassium: 2.8 mEq/L — CL (ref 3.7–5.3)
Sodium: 148 mEq/L — ABNORMAL HIGH (ref 137–147)

## 2013-07-23 LAB — CBC
HEMATOCRIT: 31.9 % — AB (ref 36.0–46.0)
Hemoglobin: 10.6 g/dL — ABNORMAL LOW (ref 12.0–15.0)
MCH: 29.3 pg (ref 26.0–34.0)
MCHC: 33.2 g/dL (ref 30.0–36.0)
MCV: 88.1 fL (ref 78.0–100.0)
Platelets: 460 10*3/uL — ABNORMAL HIGH (ref 150–400)
RBC: 3.62 MIL/uL — ABNORMAL LOW (ref 3.87–5.11)
RDW: 15.5 % (ref 11.5–15.5)
WBC: 15.7 10*3/uL — AB (ref 4.0–10.5)

## 2013-07-23 LAB — GLUCOSE, CAPILLARY: Glucose-Capillary: 143 mg/dL — ABNORMAL HIGH (ref 70–99)

## 2013-07-23 LAB — LEGIONELLA ANTIGEN, URINE: Legionella Antigen, Urine: NEGATIVE

## 2013-07-23 MED ORDER — ASPIRIN 325 MG PO TABS
325.0000 mg | ORAL_TABLET | Freq: Every day | ORAL | Status: DC
  Administered 2013-07-23 – 2013-07-25 (×3): 325 mg via ORAL
  Filled 2013-07-23 (×3): qty 1

## 2013-07-23 MED ORDER — POTASSIUM CHLORIDE 10 MEQ/100ML IV SOLN
10.0000 meq | INTRAVENOUS | Status: AC
  Administered 2013-07-23 (×4): 10 meq via INTRAVENOUS
  Filled 2013-07-23 (×4): qty 100

## 2013-07-23 MED ORDER — ENSURE PUDDING PO PUDG
1.0000 | Freq: Three times a day (TID) | ORAL | Status: DC
  Administered 2013-07-23 – 2013-07-25 (×6): 1 via ORAL

## 2013-07-23 MED ORDER — GUAIFENESIN 100 MG/5ML PO SOLN
600.0000 mg | Freq: Two times a day (BID) | ORAL | Status: DC
  Filled 2013-07-23 (×2): qty 30

## 2013-07-23 MED ORDER — GUAIFENESIN 100 MG/5ML PO SOLN
200.0000 mg | ORAL | Status: DC
  Administered 2013-07-23 – 2013-07-25 (×11): 200 mg via ORAL
  Filled 2013-07-23 (×16): qty 10

## 2013-07-23 NOTE — Evaluation (Signed)
Clinical/Bedside Swallow Evaluation Patient Details  Name: [name] MRN: [id number] Date of Birth: 1918/07/26  Today's Date: 07/23/2013 Time: [id number] SLP Time Calculation (min): 14 min  Past Medical History:  Past Medical History  Diagnosis Date  . Hyperlipidemia   . Hypertension   . Arthritis   . Anxiety   . Asthma   . Leg pain     with walking  . Peripheral arterial disease   . Dementia    Past Surgical History:  Past Surgical History  Procedure Laterality Date  . Back surgery    . Femur im nail Right 07/09/2013    Procedure: INTRAMEDULLARY (IM) NAIL FEMORAL;  Surgeon: Javier DockerJeffrey C Beane, MD;  Location: [facility] OR;  Service: Orthopedics;  Laterality: Right;   HPI:  [age] y.o. female with Past medical history of hypertension, dyslipidemia, peripheral vascular disease, dementia, recent hip fracture repair and recent pneumonia.  The patient lives with a caregiver at her group home.  Since she has been discharged she has been having some cough which has gotten worse and lasting 4 days associated with shortness of breath. He also has noted that since last few days her mentation has been progressively worse and she is more lethargic. He denies any knowledge of vomiting or diarrhea or signs of aspiration. Patient has completed course of Levaquin, so mentions that they have been giving her dysphagia type III diet.  CXR 1/3 revealed New bibasilar opacities may reflect areas of atelectasis and/or consolidation, with superimposed small bilateral pleural effusions.  CXR 12/25 Developing left lower lobe retrocardiac consolidation concerning for pneumonia.   Assessment / Plan / Recommendation Clinical Impression  Pt. with positive s/s aspiration with thin water likely due to decreased oral control and premature spill into airway.  Honey thick liquids admimistered with decreased evidence of material entering laryngeal vestibule.  Pt. has been NP since 1/3.  SLP recommends initiating Dys 1 diet texture  and honey thick liquids and MBS next date.     Aspiration Risk  Severe    Diet Recommendation Dysphagia 1 (Puree);Honey-thick liquid   Liquid Administration via: Cup;No straw Medication Administration: Whole meds with puree Supervision: Full supervision/cueing for compensatory strategies Compensations: Slow rate;Small sips/bites Postural Changes and/or Swallow Maneuvers: Seated upright 90 degrees    Other  Recommendations Recommended Consults: MBS Oral Care Recommendations: Oral care BID   Follow Up Recommendations   (to be determined)    Frequency and Duration min 2x/week  2 weeks   Pertinent Vitals/Pain No evidence pain         Swallow Study          Oral/Motor/Sensory Function Overall Oral Motor/Sensory Function:  (will assess further)   Ice Chips Ice chips: Not tested   Thin Liquid Thin Liquid: Impaired Presentation: Cup Pharyngeal  Phase Impairments: Throat Clearing - Immediate;Suspected delayed Swallow    Nectar Thick Nectar Thick Liquid: Not tested   Honey Thick Honey Thick Liquid: Not tested   Puree Puree: Impaired Pharyngeal Phase Impairments: Multiple swallows   Solid   GO    Solid: Not tested       Royce MacadamiaLisa Willis Logyn Dedominicis M.Ed [employer]-SLP Pager [phone]  07/23/2013

## 2013-07-23 NOTE — Progress Notes (Signed)
   CARE MANAGEMENT NOTE 07/23/2013  Patient:  [name],Paxton S   Account Number:  [id number]  Date Initiated:  07/23/2013  Documentation initiated by:  Darlyne RussianOLAND,Nadia Viar  Subjective/Objective Assessment:   admitted with cough, increased weakness  + Flu  lives at home with 24 hr caregivers  dc'd 07/14/2013 s/p fall, fx hip, ORIF     Action/Plan:   progression of care and discharge planning  swallow study  PT eval   Anticipated DC Date:  07/26/2013   Anticipated DC Plan:  HOME W HOME HEALTH SERVICES         Choice offered to / List presented to:             Status of service:  In process, will continue to follow Medicare Important Message given?   (If response is "NO", the following Medicare IM given date fields will be blank) Date Medicare IM given:   Date Additional Medicare IM given:    Discharge Disposition:    Per UR Regulation:    If discussed at Long Length of Stay Meetings, dates discussed:    Comments:  Contact: Margarita Mailhillip Hoffman  son  [phone]  07/23/2013  Darlyne Russiannnette Anida Deol RN, CCM  [phone] per notes of previous admission: dc'd to home on 07/14/2013 with [facility]  PT/OT/SW/ aide

## 2013-07-23 NOTE — Significant Event (Signed)
CRITICAL VALUE ALERT  Critical value received:  K-2.8  Date of notification:  07/23/13  Time of notification:  1034  Critical value read back:yes  Nurse who received alert:  De Nursey Amberle Lyter  MD notified (1st page):    Time of first page:    MD notified (2nd page):  Time of second page:  Responding MD:    Time MD responded:    Laverda SorensonATJANA Shilynn Hoch, RN

## 2013-07-23 NOTE — Progress Notes (Signed)
INITIAL NUTRITION ASSESSMENT  DOCUMENTATION CODES Per approved criteria  -Severe malnutrition in the context of chronic illness -Underweight   INTERVENTION: Add Ensure Pudding po TID, each supplement provides 170 kcal and 4 grams of protein. Continue Magic cup TID with meals, each supplement provides 290 kcal and 9 grams of protein. RD to continue to follow nutrition care plan.  NUTRITION DIAGNOSIS: Malnutrition related to chronic illness as evidenced by severe fat and muscle wasting.   Goal: Intake to meet >90% of estimated nutrition needs.  Monitor:  weight trends, lab trends, I/O's, PO intake, supplement tolerance  Reason for Assessment: Health History  80 y.o. female  Admitting Dx: HCAP (healthcare-associated pneumonia)  ASSESSMENT: PMHx significant for HTN, dyslipidemia, PVD, dementia, recent hip fx repair and recent PNA. Admitted with worsening cough x 4-5 days. Work-up reveals HCAP, suspected aspiration; pt also Influenza A positive.  Pt lives at home and has 24-hour caregivers. Pt was seen by RD staff during previous acute hospitalization, approximately 2 weeks ago. Per son and caregiver, pt has been gradually losing weight over the last 1.5 years, and has gone from 100 - 90 lb during that time frame.  Sodium is elevated at 148  Potassium is low at 2.8  Currently ordered for D5 1/2 NS at 100 ml/hr and IV KCl.  Nutrition Focused Physical Exam:   Subcutaneous Fat:  Orbital Region: severe wasting  Upper Arm Region: severe wasting  Thoracic and Lumbar Region: severe wasting   Muscle:  Temple Region: severe wasting  Clavicle Bone Region: severe wasting  Clavicle and Acromion Bone Region: severe wasting  Scapular Bone Region: severe wasting  Dorsal Hand: severe wasting  Patellar Region: severe wasting  Anterior Thigh Region: severe wasting  Posterior Calf Region: severe wasting   Edema: not present  BSE completed by SLP on 1/5 with recommendations for  Dysphagia 1 diet with Honey-Thickened liquids. Diet ordered by SLP at 1416, pt has yet to receive meal.  Height: Ht Readings from Last 1 Encounters:  07/21/13 5\' 3"  (1.6 m)    Weight: Wt Readings from Last 1 Encounters:  07/21/13 94 lb 9.2 oz (42.9 kg)    Ideal Body Weight: 115 lb  % Ideal Body Weight: 82%  Wt Readings from Last 10 Encounters:  07/21/13 94 lb 9.2 oz (42.9 kg)  07/09/13 90 lb (40.824 kg)  07/09/13 90 lb (40.824 kg)  05/28/13 90 lb 3.2 oz (40.914 kg)  11/16/12 94 lb 12.8 oz (43.001 kg)  05/27/11 106 lb (48.081 kg)    Usual Body Weight: 100 lb per son  % Usual Body Weight: 94%  BMI:  Body mass index is 16.76 kg/(m^2). Underweight  Estimated Nutritional Needs: Kcal: 1200 - 1300 Protein: 50 - 60 g Fluid: > 1.5 liters daily  Skin:  Stage I pressure ulcer to sacrum  Diet Order: Dysphagia 1; Honey Thickened Liquids  EDUCATION NEEDS: -No education needs identified at this time   Intake/Output Summary (Last 24 hours) at 07/23/13 1521 Last data filed at 07/23/13 1300  Gross per 24 hour  Intake   1050 ml  Output    650 ml  Net    400 ml    Last BM: PTA  Labs:   Recent Labs Lab 07/21/13 1815 07/22/13 0700 07/23/13 0945  NA 148* 150* 148*  K 3.6* 3.3* 2.8*  CL 110 114* 114*  CO2 26 20 21   BUN 22 21 21   CREATININE 1.10 0.96 0.84  CALCIUM 8.4 7.9* 8.2*  GLUCOSE 113* 116*  154*    CBG (last 3)   Recent Labs  07/23/13 0819  GLUCAP 143*    Scheduled Meds: . amLODipine  10 mg Oral Daily  . antiseptic oral rinse  15 mL Mouth Rinse BID  . aspirin  325 mg Oral Daily  . ceFEPime (MAXIPIME) IV  1 g Intravenous Q24H  . cloNIDine  0.3 mg Oral BID  . guaiFENesin  600 mg Oral BID  . heparin  5,000 Units Subcutaneous Q8H  . hydrALAZINE  50 mg Oral QID  . ipratropium-albuterol  3 mL Nebulization TID  . levothyroxine  75 mcg Oral QAC breakfast  . oseltamivir  30 mg Oral Daily  . potassium chloride  10 mEq Intravenous Q1 Hr x 4  .  vancomycin  500 mg Intravenous Q24H    Continuous Infusions: . dextrose 5 % and 0.45% NaCl 100 mL/hr at 07/23/13 4098    Past Medical History  Diagnosis Date  . Hyperlipidemia   . Hypertension   . Arthritis   . Anxiety   . Asthma   . Leg pain     with walking  . Peripheral arterial disease   . Dementia     Past Surgical History  Procedure Laterality Date  . Back surgery    . Femur im nail Right 07/09/2013    Procedure: INTRAMEDULLARY (IM) NAIL FEMORAL;  Surgeon: Javier Docker, MD;  Location: [facility] OR;  Service: Orthopedics;  Laterality: Right;    Jarold Motto MS, RD, LDN Pager: [phone] After-hours pager: [phone]

## 2013-07-23 NOTE — Progress Notes (Signed)
OT Cancellation Note  Patient Details Name: [name] MRN: [id number] DOB: [birth year]   Cancelled Treatment:    Reason Eval/Treat Not Completed: Patient at procedure or test/ unavailable. Pt receiving breathing tx this afternoon, will re attempt late today as time allows  Galen ManilaSpencer, Nardos Putnam Jeanette 07/23/2013, 2:41 PM

## 2013-07-23 NOTE — Progress Notes (Addendum)
PATIENT DETAILS Name: [name] Age: 80 y.o. Sex: female Date of Birth: 08-Mar-1919 Admit Date: 07/21/2013 Admitting Physician Lynden Oxford, MD ZOX:WRUE, TIFFANY, DO  Subjective: Lethargic but awake, sitting at bedisde  Assessment/Plan: Principal Problem:   HCAP (healthcare-associated pneumonia) vs Aspiration PNA -afebrile, still with leukocytosis - Suspect aspiration, await speech therapy eval -c/w Empiric Vanco/Zosyn -Blood culture 1/4 negative  Influenza - Influenza A positive-on Tamiflu   Encephalopathy - Secondary to above - Better with IV antibiotics, but still lethargic  Recent hip fracture - Continue PT/OT  Hypernatremia - Secondary to poor oral intake - C/w 0.45-Na better  Hypertension - Continue clonidine and amlodipine - Resume hydralazine in the next 1-2 days  Hypokalemia -replete, and recheck in am  Hypothyroidism - Continue levothyroxin  Alzheimer's dementia - Watch out for delirium  Severe Malnutrition -await Nutrition eval  Disposition: Remain inpatient  DVT Prophylaxis: Prophylactic Heparin   Code Status: DNR  Family Communication Son-Phillip Hoffman- over the phone on 07/23/12  Procedures:  None  CONSULTS:  None  Time spent 40 minutes-which includes 50% of the time with face-to-face with patient and RN, including discharge planning   MEDICATIONS: Scheduled Meds: . amLODipine  10 mg Oral Daily  . antiseptic oral rinse  15 mL Mouth Rinse BID  . aspirin EC  325 mg Oral Daily  . ceFEPime (MAXIPIME) IV  1 g Intravenous Q24H  . cloNIDine  0.3 mg Oral BID  . guaiFENesin  600 mg Oral BID  . heparin  5,000 Units Subcutaneous Q8H  . hydrALAZINE  50 mg Oral QID  . ipratropium-albuterol  3 mL Nebulization TID  . levothyroxine  75 mcg Oral QAC breakfast  . oseltamivir  30 mg Oral Daily  . potassium chloride  10 mEq Intravenous Q1 Hr x 4  . vancomycin  500 mg Intravenous Q24H   Continuous Infusions: . dextrose 5 %  and 0.45% NaCl 100 mL/hr at 07/23/13 0438   PRN Meds:.albuterol, hydrALAZINE  Antibiotics: Anti-infectives   Start     Dose/Rate Route Frequency Ordered Stop   07/22/13 2200  oseltamivir (TAMIFLU) capsule 75 mg  Status:  Discontinued     75 mg Oral 2 times daily 07/22/13 1618 07/22/13 1621   07/22/13 2000  ceFEPIme (MAXIPIME) 1 g in dextrose 5 % 50 mL IVPB     1 g 100 mL/hr over 30 Minutes Intravenous Every 24 hours 07/21/13 2230 07/30/13 1959   07/22/13 1630  oseltamivir (TAMIFLU) capsule 30 mg     30 mg Oral Daily 07/22/13 1621 07/27/13 0959   07/22/13 0600  ceFEPIme (MAXIPIME) 1 g in dextrose 5 % 50 mL IVPB  Status:  Discontinued     1 g 100 mL/hr over 30 Minutes Intravenous 3 times per day 07/21/13 2216 07/21/13 2229   07/21/13 2100  vancomycin (VANCOCIN) 500 mg in sodium chloride 0.9 % 100 mL IVPB     500 mg 100 mL/hr over 60 Minutes Intravenous Every 24 hours 07/21/13 2029     07/21/13 2030  ceFEPIme (MAXIPIME) 1 g in dextrose 5 % 50 mL IVPB     1 g 100 mL/hr over 30 Minutes Intravenous  Once 07/21/13 2016 07/21/13 2103       PHYSICAL EXAM: Vital signs in last 24 hours: Filed Vitals:   07/23/13 0218 07/23/13 0220 07/23/13 0437 07/23/13 0951  BP:   160/98 126/76  Pulse:   80 70  Temp:   98.1 F (36.7 C) 99.1 F (37.3 C)  TempSrc:   Oral   Resp:   19 20  Height:      Weight:      SpO2: 90% 93% 96% 94%    Weight change:  Filed Weights   07/21/13 2225  Weight: 42.9 kg (94 lb 9.2 oz)   Body mass index is 16.76 kg/(m^2).   Gen Exam: Awake, but lethargic, answers simple questions  Neck: Supple, No JVD.   Chest: Good air entry-course by basilar rales.  Transmitted upper airway sounds CVS: S1 S2 Regular Abdomen: soft, BS +, non tender, non distended.  Extremities: no edema, lower extremities warm to touch. Neurologic: Non Focal- moves all 4 extremities Skin: No Rash.   Wounds: N/A.    Intake/Output from previous day:  Intake/Output Summary (Last 24 hours) at  07/23/13 1114 Last data filed at 07/23/13 0700  Gross per 24 hour  Intake   1050 ml  Output    450 ml  Net    600 ml     LAB RESULTS: CBC  Recent Labs Lab 07/21/13 1815 07/22/13 0700 07/23/13 0945  WBC 16.0* 17.2* 15.7*  HGB 11.1* 10.9* 10.6*  HCT 34.6* 33.0* 31.9*  PLT 427* 338 460*  MCV 90.6 88.0 88.1  MCH 29.1 29.1 29.3  MCHC 32.1 33.0 33.2  RDW 15.6* 15.4 15.5  LYMPHSABS 1.1 1.0  --   MONOABS 1.0 0.9  --   EOSABS 0.0 0.0  --   BASOSABS 0.0 0.0  --     Chemistries   Recent Labs Lab 07/21/13 1815 07/22/13 0700 07/23/13 0945  NA 148* 150* 148*  K 3.6* 3.3* 2.8*  CL 110 114* 114*  CO2 26 20 21   GLUCOSE 113* 116* 154*  BUN 22 21 21   CREATININE 1.10 0.96 0.84  CALCIUM 8.4 7.9* 8.2*    CBG:  Recent Labs Lab 07/23/13 0819  GLUCAP 143*    GFR Estimated Creatinine Clearance: 28.9 ml/min (by C-G formula based on Cr of 0.84).  Coagulation profile No results found for this basename: INR, PROTIME,  in the last 168 hours  Cardiac Enzymes No results found for this basename: CK, CKMB, TROPONINI, MYOGLOBIN,  in the last 168 hours  No components found with this basename: POCBNP,  No results found for this basename: DDIMER,  in the last 72 hours No results found for this basename: HGBA1C,  in the last 72 hours No results found for this basename: CHOL, HDL, LDLCALC, TRIG, CHOLHDL, LDLDIRECT,  in the last 72 hours No results found for this basename: TSH, T4TOTAL, FREET3, T3FREE, THYROIDAB,  in the last 72 hours No results found for this basename: VITAMINB12, FOLATE, FERRITIN, TIBC, IRON, RETICCTPCT,  in the last 72 hours No results found for this basename: LIPASE, AMYLASE,  in the last 72 hours  Urine Studies No results found for this basename: UACOL, UAPR, USPG, UPH, UTP, UGL, UKET, UBIL, UHGB, UNIT, UROB, ULEU, UEPI, UWBC, URBC, UBAC, CAST, CRYS, UCOM, BILUA,  in the last 72 hours  MICROBIOLOGY: Recent Results (from the past 240 hour(s))  CULTURE, BLOOD  (ROUTINE X 2)     Status: None   Collection Time    07/22/13 12:05 AM      Result Value Range Status   Specimen Description BLOOD RIGHT UPPER ARM   Final   Special Requests BOTTLES DRAWN AEROBIC ONLY 5CC   Final   Culture  Setup Time     Final   Value: 07/22/2013 13:49     Performed at [facility]   Culture     Final   Value:        BLOOD CULTURE RECEIVED NO GROWTH TO DATE CULTURE WILL BE HELD FOR 5 DAYS BEFORE ISSUING A FINAL NEGATIVE REPORT     Performed at [facility]   Report Status PENDING   Incomplete  CULTURE, BLOOD (ROUTINE X 2)     Status: None   Collection Time    07/22/13  1:46 AM      Result Value Range Status   Specimen Description BLOOD BLOOD RIGHT FOREARM   Final   Special Requests BOTTLES DRAWN AEROBIC ONLY 3CC   Final   Culture  Setup Time     Final   Value: 07/22/2013 13:49     Performed at [facility]   Culture     Final   Value:        BLOOD CULTURE RECEIVED NO GROWTH TO DATE CULTURE WILL BE HELD FOR 5 DAYS BEFORE ISSUING A FINAL NEGATIVE REPORT     Performed at [facility]   Report Status PENDING   Incomplete    RADIOLOGY STUDIES/RESULTS: Dg Hip Complete Right  07/09/2013   CLINICAL DATA:  Right hip fracture.  EXAM: RIGHT HIP - COMPLETE 2+ VIEW  COMPARISON:  None.  FINDINGS: Acute intertrochanteric fracture of the proximal right femur present with displacement, angulation and impaction. No dislocation identified. No other pelvic fractures are seen.  IMPRESSION: Acute intertrochanteric right hip fracture.   Electronically Signed   By: Irish Lack M.D.   On: 07/09/2013 13:00   Dg Hip Operative Right  07/09/2013   CLINICAL DATA:  ORIF of the right hip.  EXAM: DG OPERATIVE RIGHT HIP  TECHNIQUE: A single spot fluoroscopic AP image of the right hip is submitted.  COMPARISON:  None.  FINDINGS: Eight fluoroscopic spot films demonstrate gamma nail placement with short intra medullary nail. The IM nail has a distal interlocking  screw.  IMPRESSION: Gamma nail fixation of the right intertrochanteric femur fracture.   Electronically Signed   By: Andreas Newport M.D.   On: 07/09/2013 20:24   Dg Pelvis Portable  07/09/2013   CLINICAL DATA:  Postop ORIF of right hip fracture  EXAM: PORTABLE PELVIS 1-2 VIEWS  COMPARISON:  AP pelvis radiographs-earlier same day  FINDINGS: Provided image demonstrates the sequela of intra medullary rod fixation of the proximal femur and dynamic screw fixation of the right femoral neck. Note, the caudal end of the right femoral intra medullary rod is excluded from view.  There is a persistently displaced avulsion fracture of the right lesser trochanter. Otherwise, there is near anatomic alignment given obliquity.  There are expected foci of subcutaneous emphysema about the operative site. Adjacent skin staples. Vascular calcifications.  IMPRESSION: Post ORIF of the right hip and femur without definite evidence of complication.   Electronically Signed   By: Simonne Come M.D.   On: 07/09/2013 21:31   Dg Pelvis Portable  07/09/2013   CLINICAL DATA:  Fall. Obvious right hip deformity at physical examination.  EXAM: PORTABLE PELVIS 1-2 VIEWS  COMPARISON:  None.  FINDINGS: Comminuted intertrochanteric right femoral neck fracture. Right hip joint anatomically aligned with marked joint space narrowing. Remainder of the pelvis not optimally imaged.  IMPRESSION: Comminuted intertrochanteric right femoral neck fracture.   Electronically Signed   By: Hulan Saas M.D.   On: 07/09/2013 10:30   Dg Chest Portable 1 View  07/21/2013   CLINICAL DATA:  Shortness of breath.  EXAM: PORTABLE CHEST - 1 VIEW  COMPARISON:  Chest x-ray 07/12/2013.  FINDINGS: Bibasilar opacities may reflect areas of atelectasis and/or consolidation, likely with superimposed small bilateral pleural effusions. No evidence of pulmonary edema. Heart size appears borderline enlarged. The patient is rotated to the left on today's exam, resulting in  distortion of the mediastinal contours and reduced diagnostic sensitivity and specificity for mediastinal pathology. Atherosclerosis in the thoracic aorta.  IMPRESSION: 1. New bibasilar opacities may reflect areas of atelectasis and/or consolidation, with superimposed small bilateral pleural effusions. 2. Atherosclerosis.   Electronically Signed   By: Trudie Reedaniel  Entrikin M.D.   On: 07/21/2013 20:00   Dg Chest Port 1 View  07/12/2013   CLINICAL DATA:  Cough  EXAM: PORTABLE CHEST - 1 VIEW  COMPARISON:  07/09/2013  FINDINGS: Mild cardiomegaly without CHF or edema. Mild hyperinflation, suspect COPD/emphysema. Right lung clear. Left lower lobe retrocardiac consolidation noted, pneumonia not excluded. Question small left effusion. No pneumothorax. Degenerative changes of the spine with an associated scoliosis. Atherosclerosis of the aorta present.  IMPRESSION: Developing left lower lobe retrocardiac consolidation concerning for pneumonia.   Electronically Signed   By: Ruel Favorsrevor  Shick M.D.   On: 07/12/2013 12:18   Dg Chest Portable 1 View  07/09/2013   CLINICAL DATA:  Fall  EXAM: PORTABLE CHEST - 1 VIEW  COMPARISON:  05/20/2012  FINDINGS: Cardiomediastinal silhouette is stable. Hyperinflation. Mild degenerative changes thoracic spine. Atherosclerotic calcifications of thoracic aorta. No acute infiltrate or pulmonary edema.  IMPRESSION: No active disease.  Hyperinflation.   Electronically Signed   By: Natasha MeadLiviu  Pop M.D.   On: 07/09/2013 11:20    Jeoffrey MassedGHIMIRE,Amanda Steuart, MD  [facility] Pager:[phone]  If 7PM-7AM, please contact night-coverage [email or web address] Password [email or web address] 07/23/2013, 11:14 AM   LOS: 2 days

## 2013-07-23 NOTE — Clinical Documentation Improvement (Signed)
Possible Clinical Conditions?  Acute Respiratory Failure Acute on Chronic Respiratory Failure Chronic Respiratory Failure Other Condition Cannot Clinically Determine   Supporting Information: "Per report the patient was hypoxic at her nursing facility, with a saturation of 80% on room air."  Treatment: Non-rebreather  Thank You, Dollene PrimroseJoan B Nastashia Gallo,RN, BSN, CCDS, Clinical Documentation Specialist:  [phone]   Cell=[phone] [facility]- [facility] Information Management

## 2013-07-23 NOTE — Progress Notes (Signed)
[facility]  Patient Status: Active (receiving services up to time of hospitalization)  [facility] is providing the following services: RN, PT, OT, MSW and HHA.  If patient discharges after hours, please call [phone].   Kendra BourgeoisMarie [name] 07/23/2013, 5:28 PM

## 2013-07-24 ENCOUNTER — Inpatient Hospital Stay (HOSPITAL_COMMUNITY): Payer: Medicare Other

## 2013-07-24 DIAGNOSIS — E43 Unspecified severe protein-calorie malnutrition: Secondary | ICD-10-CM

## 2013-07-24 LAB — BASIC METABOLIC PANEL
BUN: 18 mg/dL (ref 6–23)
CALCIUM: 7.5 mg/dL — AB (ref 8.4–10.5)
CO2: 18 mEq/L — ABNORMAL LOW (ref 19–32)
Chloride: 112 mEq/L (ref 96–112)
Creatinine, Ser: 0.84 mg/dL (ref 0.50–1.10)
GFR calc Af Amer: 68 mL/min — ABNORMAL LOW (ref >90)
GFR, EST NON AFRICAN AMERICAN: 59 mL/min — AB (ref >90)
Glucose, Bld: 118 mg/dL — ABNORMAL HIGH (ref 70–99)
POTASSIUM: 3.7 meq/L (ref 3.7–5.3)
SODIUM: 143 meq/L (ref 137–147)

## 2013-07-24 LAB — CBC
HCT: 27.5 % — ABNORMAL LOW (ref 36.0–46.0)
Hemoglobin: 8.9 g/dL — ABNORMAL LOW (ref 12.0–15.0)
MCH: 28.7 pg (ref 26.0–34.0)
MCHC: 32.4 g/dL (ref 30.0–36.0)
MCV: 88.7 fL (ref 78.0–100.0)
Platelets: 410 10*3/uL — ABNORMAL HIGH (ref 150–400)
RBC: 3.1 MIL/uL — ABNORMAL LOW (ref 3.87–5.11)
RDW: 15.9 % — AB (ref 11.5–15.5)
WBC: 10.7 10*3/uL — ABNORMAL HIGH (ref 4.0–10.5)

## 2013-07-24 LAB — MAGNESIUM: Magnesium: 2.3 mg/dL (ref 1.5–2.5)

## 2013-07-24 MED ORDER — CLONIDINE HCL 0.3 MG PO TABS
0.3000 mg | ORAL_TABLET | Freq: Three times a day (TID) | ORAL | Status: DC
  Administered 2013-07-24 – 2013-07-25 (×4): 0.3 mg via ORAL
  Filled 2013-07-24 (×5): qty 1

## 2013-07-24 MED ORDER — ACETAMINOPHEN 325 MG PO TABS
650.0000 mg | ORAL_TABLET | Freq: Four times a day (QID) | ORAL | Status: DC | PRN
  Administered 2013-07-25: 650 mg via ORAL
  Filled 2013-07-24: qty 2

## 2013-07-24 MED ORDER — RESOURCE THICKENUP CLEAR PO POWD
ORAL | Status: DC | PRN
  Filled 2013-07-24: qty 125

## 2013-07-24 MED ORDER — STARCH (THICKENING) PO POWD
ORAL | Status: DC | PRN
  Filled 2013-07-24: qty 227

## 2013-07-24 NOTE — Progress Notes (Signed)
ANTIBIOTIC CONSULT NOTE  Pharmacy Consult for Vancomycin, Cefepime Indication: Pneumonia  Allergies  Allergen Reactions  . Celebrex [Celecoxib]     unknown  . Epinephrine     unsure  . Lidocaine     unsure  . Lyrica [Pregabalin]     Caused her to spit up excessively and became dehydrated  . Morphine And Related Other (See Comments)    hallucinations  . Sulfa Antibiotics     unsure  . Ultram [Tramadol Hcl] Other (See Comments)    Spit up and become dehydrated   Labs:  Recent Labs  07/22/13 0700 07/23/13 0945 07/24/13 0502  WBC 17.2* 15.7* 10.7*  HGB 10.9* 10.6* 8.9*  PLT 338 460* 410*  CREATININE 0.96 0.84 0.84   Microbiology: Recent Results (from the past 720 hour(s))  SURGICAL PCR SCREEN     Status: Abnormal   Collection Time    07/09/13  4:04 PM      Result Value Range Status   MRSA, PCR POSITIVE (*) NEGATIVE Final   Staphylococcus aureus POSITIVE (*) NEGATIVE Final   Comment:            The Xpert SA Assay (FDA     approved for NASAL specimens     in patients over 78 years of age),     is one component of     a comprehensive surveillance     program.  Test performance has     been validated by [facility] for patients greater     than or equal to 78 year old.     It is not intended     to diagnose infection nor to     guide or monitor treatment.  URINE CULTURE     Status: None   Collection Time    07/12/13 11:05 AM      Result Value Range Status   Specimen Description URINE, CATHETERIZED   Final   Special Requests NONE   Final   Culture  Setup Time     Final   Value: 07/12/2013 12:00     Performed at [facility]   Colony Count     Final   Value: NO GROWTH     Performed at [facility]   Culture     Final   Value: NO GROWTH     Performed at [facility]   Report Status 07/13/2013 FINAL   Final  CULTURE, BLOOD (ROUTINE X 2)     Status: None   Collection Time    07/22/13 12:05 AM      Result Value Range Status   Specimen Description BLOOD RIGHT UPPER ARM   Final   Special Requests BOTTLES DRAWN AEROBIC ONLY 5CC   Final   Culture  Setup Time     Final   Value: 07/22/2013 13:49     Performed at [facility]   Culture     Final   Value:        BLOOD CULTURE RECEIVED NO GROWTH TO DATE CULTURE WILL BE HELD FOR 5 DAYS BEFORE ISSUING A FINAL NEGATIVE REPORT     Performed at [facility]   Report Status PENDING   Incomplete  CULTURE, BLOOD (ROUTINE X 2)     Status: None   Collection Time    07/22/13  1:46 AM      Result Value Range Status   Specimen Description BLOOD BLOOD RIGHT FOREARM   Final  Special Requests BOTTLES DRAWN AEROBIC ONLY 3CC   Final   Culture  Setup Time     Final   Value: 07/22/2013 13:49     Performed at [facility]   Culture     Final   Value:        BLOOD CULTURE RECEIVED NO GROWTH TO DATE CULTURE WILL BE HELD FOR 5 DAYS BEFORE ISSUING A FINAL NEGATIVE REPORT     Performed at [facility]   Report Status PENDING   Incomplete    Medical History: Past Medical History  Diagnosis Date  . Hyperlipidemia   . Hypertension   . Arthritis   . Anxiety   . Asthma   . Leg pain     with walking  . Peripheral arterial disease   . Dementia     Medications:  Anti-infectives   Start     Dose/Rate Route Frequency Ordered Stop   07/22/13 2200  oseltamivir (TAMIFLU) capsule 75 mg  Status:  Discontinued     75 mg Oral 2 times daily 07/22/13 1618 07/22/13 1621   07/22/13 2000  ceFEPIme (MAXIPIME) 1 g in dextrose 5 % 50 mL IVPB     1 g 100 mL/hr over 30 Minutes Intravenous Every 24 hours 07/21/13 2230 07/30/13 1959   07/22/13 1630  oseltamivir (TAMIFLU) capsule 30 mg     30 mg Oral Daily 07/22/13 1621 07/27/13 0959   07/22/13 0600  ceFEPIme (MAXIPIME) 1 g in dextrose 5 % 50 mL IVPB  Status:  Discontinued     1 g 100 mL/hr over 30 Minutes Intravenous 3 times per day 07/21/13 2216 07/21/13 2229   07/21/13 2100  vancomycin (VANCOCIN) 500 mg in sodium  chloride 0.9 % 100 mL IVPB     500 mg 100 mL/hr over 60 Minutes Intravenous Every 24 hours 07/21/13 2029     07/21/13 2030  ceFEPIme (MAXIPIME) 1 g in dextrose 5 % 50 mL IVPB     1 g 100 mL/hr over 30 Minutes Intravenous  Once 07/21/13 2016 07/21/13 2103     Assessment: 80 yof presented with URI and AMS after recent discharge for mechanical fall that required surgical repair of a femur fracture.   Day #4 of broad spectrum antibiotics for pneumonia,  Afebrile, WBC WNL  Blood cultures negative to date.  Goal of Therapy:  Vancomycin trough level 15-20 mcg/ml  Plan:  1. Continue Vancomycin 500mg  IV Q 24 H 2. Continue Cefepime 1 Gram iv Q 24 H 3. Continue to follow  Thank you. Okey Regal, PharmD [phone]  07/24/2013,10:28 AM

## 2013-07-24 NOTE — Clinical Social Work Note (Signed)
CSW completed assessment with son Margarita Mailhillip Hoffman today (full assessment to follow). CSW has initiated SNF search and will contact son 1/7 with bed offers.  Genelle BalVanessa Hunter Bachar, MSW, LCSW [phone]

## 2013-07-24 NOTE — Progress Notes (Addendum)
Patient ID: [name], female   DOB: 06-30-1919, 80 y.o.   MRN: [id number] Pt is 15 days s/p IM nail R femur for IT fx by Dr. Shelle IronBeane. Readmitted due to HCAP. Plan appears to be D/C to SNF tomorrow. Dr. Shelle IronBeane is aware of readmission.  Will obtain xrays R hip to check fx alignment Continue PWB (50%), likely 6 weeks post-op Continue PT Okay to remove staples and place steris over incisions Will see either tonight or tomorrow AM and review xrays Will plan to follow up again in 4 weeks (6 weeks post-op) as outpt for repeat xrays Called son Aneta Minshillip to discuss plan at [phone], he requested another call when I see her as well. Also spoke with Dr. Jerral RalphGhimire about the above plan.

## 2013-07-24 NOTE — Progress Notes (Addendum)
PATIENT DETAILS Name: [name] Age: 80 y.o. Sex: female Date of Birth: 1919/03/25 Admit Date: 07/21/2013 Admitting Physician Lynden Oxford, MD RUE:AVWU, TIFFANY, DO  Subjective: Lethargic but awake, sitting at bedisde  Assessment/Plan: Principal Problem:   HCAP (healthcare-associated pneumonia) vs Aspiration PNA -afebrile, still with leukocytosis - Suspect aspiration, await speech therapy eval - On empiric Vanco/cefepime since admission, will discontinue vancomycin today.  -Blood culture 1/4 negative  Influenza - Influenza A positive-on Tamiflu   Acute Hypoxic Resp Failure -2/2 to above -better with Antibiotics/Tamiflu  Encephalopathy - Secondary to above - Better with IV antibiotics,lethargic but clearly better than the past few days  Recent hip fracture - Continue PT/OT -will notify Dr Shelle Iron of re-admit  Hypernatremia - Secondary to poor oral intake - resolved  Hypertension -uncontrolled -change clonidine to TID, c/w Amlodipine and Hydralazine  Hypokalemia -resolved, recheck periodically  Hypothyroidism - Continue levothyroxin  Alzheimer's dementia - Watch out for delirium  Severe Malnutrition -c/w supplements  Disposition: Remain inpatient-SNF with Hospice follow up on 1/7-son agreeable  DVT Prophylaxis: Prophylactic Heparin   Code Status: DNR  Family Communication Son-Phillip Hoffman  Procedures:  None  CONSULTS:  None   MEDICATIONS: Scheduled Meds: . amLODipine  10 mg Oral Daily  . antiseptic oral rinse  15 mL Mouth Rinse BID  . aspirin  325 mg Oral Daily  . ceFEPime (MAXIPIME) IV  1 g Intravenous Q24H  . cloNIDine  0.3 mg Oral BID  . feeding supplement (ENSURE)  1 Container Oral TID BM  . guaiFENesin  200 mg Oral Q4H  . heparin  5,000 Units Subcutaneous Q8H  . hydrALAZINE  50 mg Oral QID  . ipratropium-albuterol  3 mL Nebulization TID  . levothyroxine  75 mcg Oral QAC breakfast  . oseltamivir  30 mg Oral Daily  .  vancomycin  500 mg Intravenous Q24H   Continuous Infusions: . dextrose 5 % and 0.45% NaCl 100 mL/hr at 07/24/13 0500   PRN Meds:.acetaminophen, albuterol, food thickener, hydrALAZINE, RESOURCE THICKENUP CLEAR  Antibiotics: Anti-infectives   Start     Dose/Rate Route Frequency Ordered Stop   07/22/13 2200  oseltamivir (TAMIFLU) capsule 75 mg  Status:  Discontinued     75 mg Oral 2 times daily 07/22/13 1618 07/22/13 1621   07/22/13 2000  ceFEPIme (MAXIPIME) 1 g in dextrose 5 % 50 mL IVPB     1 g 100 mL/hr over 30 Minutes Intravenous Every 24 hours 07/21/13 2230 07/30/13 1959   07/22/13 1630  oseltamivir (TAMIFLU) capsule 30 mg     30 mg Oral Daily 07/22/13 1621 07/27/13 0959   07/22/13 0600  ceFEPIme (MAXIPIME) 1 g in dextrose 5 % 50 mL IVPB  Status:  Discontinued     1 g 100 mL/hr over 30 Minutes Intravenous 3 times per day 07/21/13 2216 07/21/13 2229   07/21/13 2100  vancomycin (VANCOCIN) 500 mg in sodium chloride 0.9 % 100 mL IVPB     500 mg 100 mL/hr over 60 Minutes Intravenous Every 24 hours 07/21/13 2029     07/21/13 2030  ceFEPIme (MAXIPIME) 1 g in dextrose 5 % 50 mL IVPB     1 g 100 mL/hr over 30 Minutes Intravenous  Once 07/21/13 2016 07/21/13 2103       PHYSICAL EXAM: Vital signs in last 24 hours: Filed Vitals:   07/23/13 2055 07/23/13 2102 07/24/13 0536 07/24/13 0957  BP: 160/60  165/73 187/78  Pulse: 76  79   Temp: 97.5 F (36.4  C)  97.4 F (36.3 C) 98.4 F (36.9 C)  TempSrc: Oral  Oral Axillary  Resp: 17  16 16   Height:      Weight: 47.58 kg (104 lb 14.3 oz)     SpO2: 92% 92% 96% 98%    Weight change:  Filed Weights   07/21/13 2225 07/23/13 2055  Weight: 42.9 kg (94 lb 9.2 oz) 47.58 kg (104 lb 14.3 oz)   Body mass index is 18.59 kg/(m^2).   Gen Exam: Awake, but mildly lethargic, answers simple questions  Neck: Supple, No JVD.   Chest: Good air entry-coarse by basilar rales.  Transmitted upper airway sounds CVS: S1 S2 Regular Abdomen: soft, BS +,  non tender, non distended.  Extremities: no edema, lower extremities warm to touch. Neurologic: Non Focal- moves all 4 extremities Skin: No Rash.   Wounds: N/A.    Intake/Output from previous day:  Intake/Output Summary (Last 24 hours) at 07/24/13 1216 Last data filed at 07/24/13 0900  Gross per 24 hour  Intake   2200 ml  Output    200 ml  Net   2000 ml     LAB RESULTS: CBC  Recent Labs Lab 07/21/13 1815 07/22/13 0700 07/23/13 0945 07/24/13 0502  WBC 16.0* 17.2* 15.7* 10.7*  HGB 11.1* 10.9* 10.6* 8.9*  HCT 34.6* 33.0* 31.9* 27.5*  PLT 427* 338 460* 410*  MCV 90.6 88.0 88.1 88.7  MCH 29.1 29.1 29.3 28.7  MCHC 32.1 33.0 33.2 32.4  RDW 15.6* 15.4 15.5 15.9*  LYMPHSABS 1.1 1.0  --   --   MONOABS 1.0 0.9  --   --   EOSABS 0.0 0.0  --   --   BASOSABS 0.0 0.0  --   --     Chemistries   Recent Labs Lab 07/21/13 1815 07/22/13 0700 07/23/13 0945 07/24/13 0502  NA 148* 150* 148* 143  K 3.6* 3.3* 2.8* 3.7  CL 110 114* 114* 112  CO2 26 20 21  18*  GLUCOSE 113* 116* 154* 118*  BUN 22 21 21 18   CREATININE 1.10 0.96 0.84 0.84  CALCIUM 8.4 7.9* 8.2* 7.5*  MG  --   --   --  2.3    CBG:  Recent Labs Lab 07/23/13 0819  GLUCAP 143*    GFR Estimated Creatinine Clearance: 32.1 ml/min (by C-G formula based on Cr of 0.84).  Coagulation profile No results found for this basename: INR, PROTIME,  in the last 168 hours  Cardiac Enzymes No results found for this basename: CK, CKMB, TROPONINI, MYOGLOBIN,  in the last 168 hours  No components found with this basename: POCBNP,  No results found for this basename: DDIMER,  in the last 72 hours No results found for this basename: HGBA1C,  in the last 72 hours No results found for this basename: CHOL, HDL, LDLCALC, TRIG, CHOLHDL, LDLDIRECT,  in the last 72 hours No results found for this basename: TSH, T4TOTAL, FREET3, T3FREE, THYROIDAB,  in the last 72 hours No results found for this basename: VITAMINB12, FOLATE,  FERRITIN, TIBC, IRON, RETICCTPCT,  in the last 72 hours No results found for this basename: LIPASE, AMYLASE,  in the last 72 hours  Urine Studies No results found for this basename: UACOL, UAPR, USPG, UPH, UTP, UGL, UKET, UBIL, UHGB, UNIT, UROB, ULEU, UEPI, UWBC, URBC, UBAC, CAST, CRYS, UCOM, BILUA,  in the last 72 hours  MICROBIOLOGY: Recent Results (from the past 240 hour(s))  CULTURE, BLOOD (ROUTINE X 2)  Status: None   Collection Time    07/22/13 12:05 AM      Result Value Range Status   Specimen Description BLOOD RIGHT UPPER ARM   Final   Special Requests BOTTLES DRAWN AEROBIC ONLY 5CC   Final   Culture  Setup Time     Final   Value: 07/22/2013 13:49     Performed at [facility]   Culture     Final   Value:        BLOOD CULTURE RECEIVED NO GROWTH TO DATE CULTURE WILL BE HELD FOR 5 DAYS BEFORE ISSUING A FINAL NEGATIVE REPORT     Performed at [facility]   Report Status PENDING   Incomplete  CULTURE, BLOOD (ROUTINE X 2)     Status: None   Collection Time    07/22/13  1:46 AM      Result Value Range Status   Specimen Description BLOOD BLOOD RIGHT FOREARM   Final   Special Requests BOTTLES DRAWN AEROBIC ONLY 3CC   Final   Culture  Setup Time     Final   Value: 07/22/2013 13:49     Performed at [facility]   Culture     Final   Value:        BLOOD CULTURE RECEIVED NO GROWTH TO DATE CULTURE WILL BE HELD FOR 5 DAYS BEFORE ISSUING A FINAL NEGATIVE REPORT     Performed at [facility]   Report Status PENDING   Incomplete    RADIOLOGY STUDIES/RESULTS: Dg Hip Complete Right  07/09/2013   CLINICAL DATA:  Right hip fracture.  EXAM: RIGHT HIP - COMPLETE 2+ VIEW  COMPARISON:  None.  FINDINGS: Acute intertrochanteric fracture of the proximal right femur present with displacement, angulation and impaction. No dislocation identified. No other pelvic fractures are seen.  IMPRESSION: Acute intertrochanteric right hip fracture.   Electronically Signed    By: Irish LackGlenn  Yamagata M.D.   On: 07/09/2013 13:00   Dg Hip Operative Right  07/09/2013   CLINICAL DATA:  ORIF of the right hip.  EXAM: DG OPERATIVE RIGHT HIP  TECHNIQUE: A single spot fluoroscopic AP image of the right hip is submitted.  COMPARISON:  None.  FINDINGS: Eight fluoroscopic spot films demonstrate gamma nail placement with short intra medullary nail. The IM nail has a distal interlocking screw.  IMPRESSION: Gamma nail fixation of the right intertrochanteric femur fracture.   Electronically Signed   By: Andreas NewportGeoffrey  Lamke M.D.   On: 07/09/2013 20:24   Dg Pelvis Portable  07/09/2013   CLINICAL DATA:  Postop ORIF of right hip fracture  EXAM: PORTABLE PELVIS 1-2 VIEWS  COMPARISON:  AP pelvis radiographs-earlier same day  FINDINGS: Provided image demonstrates the sequela of intra medullary rod fixation of the proximal femur and dynamic screw fixation of the right femoral neck. Note, the caudal end of the right femoral intra medullary rod is excluded from view.  There is a persistently displaced avulsion fracture of the right lesser trochanter. Otherwise, there is near anatomic alignment given obliquity.  There are expected foci of subcutaneous emphysema about the operative site. Adjacent skin staples. Vascular calcifications.  IMPRESSION: Post ORIF of the right hip and femur without definite evidence of complication.   Electronically Signed   By: Simonne ComeJohn  Watts M.D.   On: 07/09/2013 21:31   Dg Pelvis Portable  07/09/2013   CLINICAL DATA:  Fall. Obvious right hip deformity at physical examination.  EXAM: PORTABLE PELVIS 1-2 VIEWS  COMPARISON:  None.  FINDINGS: Comminuted intertrochanteric right femoral neck fracture. Right hip joint anatomically aligned with marked joint space narrowing. Remainder of the pelvis not optimally imaged.  IMPRESSION: Comminuted intertrochanteric right femoral neck fracture.   Electronically Signed   By: Hulan Saas M.D.   On: 07/09/2013 10:30   Dg Chest Portable 1  View  07/21/2013   CLINICAL DATA:  Shortness of breath.  EXAM: PORTABLE CHEST - 1 VIEW  COMPARISON:  Chest x-ray 07/12/2013.  FINDINGS: Bibasilar opacities may reflect areas of atelectasis and/or consolidation, likely with superimposed small bilateral pleural effusions. No evidence of pulmonary edema. Heart size appears borderline enlarged. The patient is rotated to the left on today's exam, resulting in distortion of the mediastinal contours and reduced diagnostic sensitivity and specificity for mediastinal pathology. Atherosclerosis in the thoracic aorta.  IMPRESSION: 1. New bibasilar opacities may reflect areas of atelectasis and/or consolidation, with superimposed small bilateral pleural effusions. 2. Atherosclerosis.   Electronically Signed   By: Trudie Reed M.D.   On: 07/21/2013 20:00   Dg Chest Port 1 View  07/12/2013   CLINICAL DATA:  Cough  EXAM: PORTABLE CHEST - 1 VIEW  COMPARISON:  07/09/2013  FINDINGS: Mild cardiomegaly without CHF or edema. Mild hyperinflation, suspect COPD/emphysema. Right lung clear. Left lower lobe retrocardiac consolidation noted, pneumonia not excluded. Question small left effusion. No pneumothorax. Degenerative changes of the spine with an associated scoliosis. Atherosclerosis of the aorta present.  IMPRESSION: Developing left lower lobe retrocardiac consolidation concerning for pneumonia.   Electronically Signed   By: Ruel Favors M.D.   On: 07/12/2013 12:18   Dg Chest Portable 1 View  07/09/2013   CLINICAL DATA:  Fall  EXAM: PORTABLE CHEST - 1 VIEW  COMPARISON:  05/20/2012  FINDINGS: Cardiomediastinal silhouette is stable. Hyperinflation. Mild degenerative changes thoracic spine. Atherosclerotic calcifications of thoracic aorta. No acute infiltrate or pulmonary edema.  IMPRESSION: No active disease.  Hyperinflation.   Electronically Signed   By: Natasha Mead M.D.   On: 07/09/2013 11:20    Jeoffrey Massed, MD  [facility] Pager:[phone]  If 7PM-7AM,  please contact night-coverage [email or web address] Password [email or web address] 07/24/2013, 12:16 PM   LOS: 3 days

## 2013-07-24 NOTE — Procedures (Signed)
Objective Swallowing Evaluation: Modified Barium Swallowing Study  Patient Details  Name: [name] MRN: [id number] Date of Birth: 30-Mar-1919  Today's Date: 07/24/2013 Time: 0940-1000 SLP Time Calculation (min): 20 min  Past Medical History:  Past Medical History  Diagnosis Date  . Hyperlipidemia   . Hypertension   . Arthritis   . Anxiety   . Asthma   . Leg pain     with walking  . Peripheral arterial disease   . Dementia    Past Surgical History:  Past Surgical History  Procedure Laterality Date  . Back surgery    . Femur im nail Right 07/09/2013    Procedure: INTRAMEDULLARY (IM) NAIL FEMORAL;  Surgeon: Javier DockerJeffrey C Beane, MD;  Location: [facility] OR;  Service: Orthopedics;  Laterality: Right;   HPI:  80 y.o. female with Past medical history of hypertension, dyslipidemia, peripheral vascular disease, dementia, recent hip fracture repair and recent pneumonia.  The patient lives with a caregiver at her group home.  Since she has been discharged she has been having some cough which has gotten worse and lasting 4 days associated with shortness of breath. He also has noted that since last few days her mentation has been progressively worse and she is more lethargic. He denies any knowledge of vomiting or diarrhea or signs of aspiration. Patient has completed course of Levaquin, so mentions that they have been giving her dysphagia type III diet.  CXR 1/3 revealed New bibasilar opacities may reflect areas of atelectasis and/or consolidation, with superimposed small bilateral pleural effusions.  CXR 12/25 Developing left lower lobe retrocardiac consolidation concerning for pneumonia.     Assessment / Plan / Recommendation Clinical Impression  Dysphagia Diagnosis: Mild oral phase dysphagia;Moderate pharyngeal phase dysphagia;Severe pharyngeal phase dysphagia Clinical impression: Pt. exhibited mild oral dysphagia evidenced by delayed manipulation and transit.  Moderate-severe sensorimotor  dysphagia pharyngeal dysphagia marked by decreased sensation with delayed initiation, reduced laryngeal elevation leading to frank aspiration with honey liquids.  Reflexive cough ineffective to clear aspirates.  Decreased tongue base retraction and laryngeal elevation led to laryngeal penetration with nectar barium, vallecular and pyriform sinus residue that reduced with verbal cues to second swallow.  Any consistency thinner than puree/pudding is unsafe for pt.  Uncertain as to chronic or acute nature of dysphagia but suspect more chronic and exacerbated by flu.  Recommending Dys 1 diet texture and pudding thick liquids. Swallow function may improve if strength/ endurance improves (?).Would Palliative care consult be beneficial?     Treatment Recommendation  Therapy as outlined in treatment plan below    Diet Recommendation Dysphagia 1 (Puree);No liquids;Pudding-thick liquid   Liquid Administration via: Spoon Medication Administration: Whole meds with puree Supervision: Full supervision/cueing for compensatory strategies Compensations: Small sips/bites;Slow rate;Multiple dry swallows after each bite/sip Postural Changes and/or Swallow Maneuvers: Seated upright 90 degrees    Other  Recommendations Oral Care Recommendations: Oral care BID   Follow Up Recommendations   (TBD)    Frequency and Duration min 2x/week  2 weeks   Pertinent Vitals/Pain WDL         Reason for Referral Objectively evaluate swallowing function   Oral Phase Oral Preparation/Oral Phase Oral Phase: Impaired Oral - Honey Oral - Honey Teaspoon: Delayed oral transit;Reduced posterior propulsion;Weak lingual manipulation Oral - Honey Cup: Delayed oral transit;Reduced posterior propulsion;Weak lingual manipulation Oral - Nectar Oral - Nectar Teaspoon: Delayed oral transit;Reduced posterior propulsion;Weak lingual manipulation Oral - Solids Oral - Puree: Delayed oral transit;Reduced posterior propulsion;Weak lingual  manipulation  Pharyngeal Phase Pharyngeal Phase Pharyngeal Phase: Impaired Pharyngeal - Honey Pharyngeal - Honey Teaspoon: Delayed swallow initiation;Premature spillage to valleculae;Pharyngeal residue - valleculae;Reduced tongue base retraction;Pharyngeal residue - pyriform sinuses;Reduced laryngeal elevation;Penetration/Aspiration during swallow Penetration/Aspiration details (honey teaspoon): Material enters airway, remains ABOVE vocal cords and not ejected out Pharyngeal - Honey Cup: Delayed swallow initiation;Premature spillage to valleculae;Penetration/Aspiration during swallow;Pharyngeal residue - pyriform sinuses;Pharyngeal residue - valleculae;Reduced tongue base retraction;Reduced laryngeal elevation Penetration/Aspiration details (honey cup): Material enters airway, passes BELOW cords and not ejected out despite cough attempt by patient (frank aspiration) Pharyngeal - Nectar Pharyngeal - Nectar Teaspoon: Delayed swallow initiation;Premature spillage to pyriform sinuses;Premature spillage to valleculae;Penetration/Aspiration during swallow;Pharyngeal residue - valleculae;Pharyngeal residue - pyriform sinuses;Reduced tongue base retraction;Reduced laryngeal elevation Penetration/Aspiration details (nectar teaspoon): Material enters airway, remains ABOVE vocal cords and not ejected out (questionable aspiration) Pharyngeal - Nectar Cup: Not tested Pharyngeal - Solids Pharyngeal - Puree: Delayed swallow initiation;Premature spillage to valleculae  Cervical Esophageal Phase    GO    Cervical Esophageal Phase Cervical Esophageal Phase: Leonarda Salon         Darrow [employer] [phone]  07/24/2013

## 2013-07-24 NOTE — Progress Notes (Signed)
OT Cancellation Note  Patient Details Name: Randel BooksMabel S Protzman MRN: [id number] DOB: 11/16/1918   Cancelled Treatment:    Reason Eval/Treat Not Completed: Patient at procedure or test/ unavailable. Pt off floor for x ray, will re attempt later today  Galen ManilaSpencer, Esmae Donathan Jeanette 07/24/2013, 10:13 AM

## 2013-07-24 NOTE — Care Management Note (Signed)
   CARE MANAGEMENT NOTE 07/24/2013  Patient:  [name], [name]   Account Number:  [id number]  Date Initiated:  07/23/2013  Documentation initiated by:  Lizabeth Leyden  Subjective/Objective Assessment:   admitted with cough, increased weakness  + Flu  lives at home with 24 hr caregivers  dc'd 07/13/2013 s/p fall, fx hip, ORIF     Action/Plan:   progression of care and discharge planning  swallow study  PT eval   Anticipated DC Date:  07/26/2013   Anticipated DC Plan:  SKILLED NURSING FACILITY         Choice offered to / List presented to:             Status of service:  In process, will continue to follow Medicare Important Message given?   (If response is "NO", the following Medicare IM given date fields will be blank) Date Medicare IM given:   Date Additional Medicare IM given:    Discharge Disposition:    Per UR Regulation:    If discussed at Long Length of Stay Meetings, dates discussed:    Comments:  Contact: [name]  son  [phone]  07/24/2013 Met with pt son, [name] re d/c plan, pt will need short term SNF, CSW [name] spoke with Mr [name] as well and will begin search for SNF. Will continue to follow for d/c needs and progression. Kendra Pang RN MPH, case manager [phone]  07/23/2013  [place], CCM  [phone] per notes of previous admission: dc'd to home on 07/14/2013 with [name]  PT/OT/SW/ aide

## 2013-07-24 NOTE — Progress Notes (Signed)
Physical Therapy Treatment Patient Details Name: Kendra BooksMabel S Fern MRN: [id number] DOB: [birth year] Today's Date: 07/24/2013 Time: 1415-1450 PT Time Calculation (min): 35 min  PT Assessment / Plan / Recommendation  History of Present Illness Pt is a 80 y.o. female adm with [facility]. pt with recent hospitalization due to fall and was s/p IM femoral nail.    PT Comments   Patient pleasantly confused, but seems to be tolerating out of bed mobility less than last session with worsened sitting balance.  Demonstrated SOB sitting edge of bed and audible crackles.  She will need extended rehab and son now seeking SNF level care.  Will follow acutely.  Follow Up Recommendations  SNF;Supervision/Assistance - 24 hour     Does the patient have the potential to tolerate intense rehabilitation   N/A  Barriers to Discharge  None      Equipment Recommendations  None recommended by PT    Recommendations for Other Services  None  Frequency Min 2X/week   Progress towards PT Goals Progress towards PT goals: Progressing toward goals  Plan Current plan remains appropriate    Precautions / Restrictions Precautions Precautions: Fall Restrictions Weight Bearing Restrictions: Yes RLE Weight Bearing: Partial weight bearing RLE Partial Weight Bearing Percentage or Pounds: 50%   Pertinent Vitals/Pain Min c/o right hip and shoulder pain with mobility    Mobility  Bed Mobility Bed Mobility: Supine to Sit;Sitting - Scoot to Edge of Bed Supine to Sit: 1: +2 Total assist Supine to Sit: Patient Percentage: 20% Sitting - Scoot to Edge of Bed: 1: +1 Total assist Details for Bed Mobility Assistance: instructional and tactile cues given for use of left LE for scooting to edge of bed prior to lifting trunk upright Transfers Sit to Stand: From bed;With upper extremity assist;1: +2 Total assist Sit to Stand: Patient Percentage: 30% Stand to Sit: 1: +1 Total assist;To chair/3-in-1;With upper extremity assist;With  armrests Stand Pivot Transfers: 1: +2 Total assist Stand Pivot Transfers: Patient Percentage: 30% Details for Transfer Assistance: used walker and needed cues for stepping around to chair Ambulation/Gait Ambulation/Gait Assistance: Not tested (comment)    Exercises General Exercises - Lower Extremity Short Arc Quad: AROM;Both;5 reps;Seated Long Arc Quad: AAROM;Both;5 reps;Seated Hip Flexion/Marching: AAROM;5 reps;Seated;Both     PT Goals (current goals can now be found in the care plan section) Acute Rehab PT Goals Patient Stated Goal: none stated  Visit Information  Last PT Received On: 07/24/13 Assistance Needed: +2 PT/OT/SLP Co-Evaluation/Treatment: Yes Reason for Co-Treatment: Complexity of the patient's impairments (multi-system involvement) PT goals addressed during session: Mobility/safety with mobility;Strengthening/ROM OT goals addressed during session: ADL's and self-care History of Present Illness: Pt is a 80 y.o. female adm with HCAP. pt with recent hospitalization due to fall and was s/p IM femoral nail.     Subjective Data  Patient Stated Goal: none stated   Cognition  Cognition Arousal/Alertness: Awake/alert Behavior During Therapy: Restless Overall Cognitive Status: History of cognitive impairments - at baseline Memory: Decreased recall of precautions;Decreased short-term memory    Balance  Balance Balance Assessed: Yes Static Sitting Balance Static Sitting - Balance Support: Feet supported Static Sitting - Level of Assistance: 3: Mod assist Static Sitting - Comment/# of Minutes: sitting about 3 minutes with posterior bias and SOB c/o fatigue and wanting to get to chair quickly Static Standing Balance Static Standing - Balance Support: Bilateral upper extremity supported Static Standing - Level of Assistance: 3: Mod assist  End of Session PT - End of Session Equipment  Utilized During Treatment: Gait belt;Oxygen Activity Tolerance: Patient limited by  fatigue Patient left: in chair;with call bell/phone within reach;with nursing/sitter in room Nurse Communication: Mobility status   GP     [facility] 07/24/2013, 3:21 PM [place], [place] [phone] 07/24/2013

## 2013-07-24 NOTE — Evaluation (Signed)
Occupational Therapy Evaluation Patient Details Name: [name] MRN: [id number] DOB: [birth year] Today's Date: 07/24/2013 Time: 4098-1191 OT Time Calculation (min): 27 min  OT Assessment / Plan / Recommendation History of present illness Pt is a 80 y.o. female adm with HCAP. pt with recent hospitalization due to fall and was s/p IM femoral nail.    Clinical Impression   Pt demos decline in function with ADLs and ADL mobility safety with decreased strength, balance and endurance. Pt requires max - total A with ADLs and to initiate ADL mobility at thus time. Pt demos difficulty following/processing commands. No further acute OT services indicated at this time, will defer further OT intervention to SNF    OT Assessment  Patient does not need any further OT services    Follow Up Recommendations  SNF;Supervision/Assistance - 24 hour    Barriers to Discharge  none    Equipment Recommendations  None recommended by OT;Other (comment) (TBD at next venue of care)    Recommendations for Other Services    Frequency       Precautions / Restrictions Precautions Precautions: Fall Restrictions Weight Bearing Restrictions: Yes RLE Weight Bearing: Partial weight bearing RLE Partial Weight Bearing Percentage or Pounds: 50%   Pertinent Vitals/Pain reports pain in L shoulder but did not rate    ADL  Grooming: Performed;Wash/dry hands;Wash/dry face;Supervision/safety;Set up Where Assessed - Grooming: Supported sitting Upper Body Bathing: Simulated;Maximal assistance Lower Body Bathing: Simulated;+1 Total assistance Upper Body Dressing: Performed;Maximal assistance Lower Body Dressing: +1 Total assistance Toilet Transfer: Simulated;+1 Total assistance Toilet Transfer Method: Sit to stand Toileting - Clothing Manipulation and Hygiene: +1 Total assistance Where Assessed - Toileting Clothing Manipulation and Hygiene: Standing Tub/Shower Transfer Method: Not assessed Transfers/Ambulation  Related to ADLs: total A  for bed mobility and transfers ADL Comments: extensive assist with ADLs, difficulty following/processing instructions for ADLs, required verbal and physical cues to initiate    OT Diagnosis:    OT Problem List:   OT Treatment Interventions:     OT Goals(Current goals can be found in the care plan section) Acute Rehab OT Goals Patient Stated Goal: none stated  Visit Information  Last OT Received On: 07/24/13 Assistance Needed: +2 PT/OT/SLP Co-Evaluation/Treatment: Yes Reason for Co-Treatment: Complexity of the patient's impairments (multi-system involvement);For patient/therapist safety OT goals addressed during session: ADL's and self-care History of Present Illness: Pt is a 80 y.o. female adm with HCAP. pt with recent hospitalization due to fall and was s/p IM femoral nail.        Prior Functioning     Home Living Family/patient expects to be discharged to:: Private residence Living Arrangements: Children Available Help at Discharge: Personal care attendant;Available 24 hours/day Type of Home: House Home Access: Stairs to enter [employer] of Steps: 2 Home Layout: One level Home Equipment: Walker - 2 wheels;Bedside commode Prior Function Level of Independence: Needs assistance Gait / Transfers Assistance Needed: S for all with RW ADL's / Homemaking Assistance Needed: Total A except she could feed herself Comments: no family present to determine PLOF; per chart pt was total (A) upon last D/C  Communication Communication: No difficulties Dominant Hand: Right         Vision/Perception     Cognition  Cognition Arousal/Alertness: Awake/alert Behavior During Therapy: Anxious Overall Cognitive Status: History of cognitive impairments - at baseline Memory: Decreased recall of precautions;Decreased short-term memory    Extremity/Trunk Assessment Upper Extremity Assessment Upper Extremity Assessment: Generalized weakness;LUE  deficits/detail LUE Deficits / Details: edema L  forearm, c/o shoulder pain LUE: Unable to fully assess due to pain Lower Extremity Assessment Lower Extremity Assessment: Defer to PT evaluation Cervical / Trunk Assessment Cervical / Trunk Assessment: Kyphotic     Mobility Bed Mobility Bed Mobility: Supine to Sit;Sitting - Scoot to Edge of Bed Supine to Sit: 1: +2 Total assist Sitting - Scoot to Edge of Bed: 1: +1 Total assist Transfers Transfers: Sit to Stand;Stand to Sit Sit to Stand: From bed;With upper extremity assist;1: +2 Total assist Stand to Sit: 1: +1 Total assist;To chair/3-in-1;With upper extremity assist;With armrests Details for Transfer Assistance: difficulty following commands, cues for correct hand placement     Exercise     Balance     End of Session OT - End of Session Equipment Utilized During Treatment: Gait belt;Rolling walker Activity Tolerance: Patient limited by fatigue Patient left: in chair;with call bell/phone within reach;with chair alarm set  GO     Kendra ManilaSpencer, [name] [name] 07/24/2013, 3:11 PM

## 2013-07-24 NOTE — Progress Notes (Signed)
Speech Language Pathology   Patient Details Name: Kendra BooksMabel S Richwine MRN: [id number] DOB: 1918/10/28 Today's Date: 07/24/2013 Time: 0940-1000 SLP Time Calculation (min): 20 min  Full assessment will be documented. Recommend Dys 1 and pudding thick liquids, can attempt pills in applesauce (or crush if needed)   Darrow BussingLisa Willis Abdulwahab Demelo M.Ed [employer]-SLP Pager [phone]  07/24/2013

## 2013-07-25 MED ORDER — OXYCODONE-ACETAMINOPHEN 5-325 MG PO TABS: 1.0000 | ORAL_TABLET | ORAL | Status: AC | PRN

## 2013-07-25 MED ORDER — IPRATROPIUM-ALBUTEROL 0.5-2.5 (3) MG/3ML IN SOLN: 3.0000 mL | RESPIRATORY_TRACT | Status: DC | PRN

## 2013-07-25 MED ORDER — ACETAMINOPHEN 325 MG PO TABS: 650.0000 mg | ORAL_TABLET | Freq: Four times a day (QID) | ORAL | Status: AC | PRN

## 2013-07-25 MED ORDER — HYDRALAZINE HCL 50 MG PO TABS: 75.0000 mg | ORAL_TABLET | Freq: Four times a day (QID) | ORAL | Status: AC

## 2013-07-25 MED ORDER — MOXIFLOXACIN HCL 400 MG PO TABS: 400.0000 mg | ORAL_TABLET | Freq: Every day | ORAL | Status: AC

## 2013-07-25 MED ORDER — ENSURE PUDDING PO PUDG: 1.0000 | Freq: Three times a day (TID) | ORAL | Status: AC

## 2013-07-25 MED ORDER — CLONIDINE HCL 0.3 MG PO TABS: 0.3000 mg | ORAL_TABLET | Freq: Three times a day (TID) | ORAL | Status: AC

## 2013-07-25 MED ORDER — GUAIFENESIN 100 MG/5ML PO SOLN: 200.0000 mg | ORAL | Status: AC

## 2013-07-25 MED ORDER — HYDRALAZINE HCL 50 MG PO TABS
75.0000 mg | ORAL_TABLET | Freq: Four times a day (QID) | ORAL | Status: DC
  Administered 2013-07-25 (×3): 75 mg via ORAL
  Filled 2013-07-25 (×5): qty 1

## 2013-07-25 MED ORDER — ALBUTEROL SULFATE (2.5 MG/3ML) 0.083% IN NEBU: 2.5000 mg | INHALATION_SOLUTION | Freq: Four times a day (QID) | RESPIRATORY_TRACT | Status: AC | PRN

## 2013-07-25 MED ORDER — STARCH (THICKENING) PO POWD: ORAL | Status: AC

## 2013-07-25 MED ORDER — OSELTAMIVIR PHOSPHATE 30 MG PO CAPS: 30.0000 mg | ORAL_CAPSULE | Freq: Every day | ORAL | Status: AC

## 2013-07-25 MED ORDER — ALPRAZOLAM 0.25 MG PO TABS: 0.2500 mg | ORAL_TABLET | Freq: Three times a day (TID) | ORAL | Status: AC | PRN

## 2013-07-25 NOTE — Consult Note (Signed)
Reason for Consult:follow up R hip fx Referring Physician: Dr. Carlene Coria [name] is an 80 y.o. female.  HPI: [name] is 16 days s/p IM nail R proximal femur for IT fx R hip by Dr. Shelle Iron. She was readmitted for HCAP. Planned for D/C to SNF today. She has not yet been seen in our office for follow up and we are asked to see her while she is in the hospital. She does have dementia. Has no c/o this AM. Denies pain in the hip.  Past Medical History  Diagnosis Date  . Hyperlipidemia   . Hypertension   . Arthritis   . Anxiety   . Asthma   . Leg pain     with walking  . Peripheral arterial disease   . Dementia     Past Surgical History  Procedure Laterality Date  . Back surgery    . Femur im nail Right 07/09/2013    Procedure: INTRAMEDULLARY (IM) NAIL FEMORAL;  Surgeon: Javier Docker, MD;  Location: [facility] OR;  Service: Orthopedics;  Laterality: Right;    Family History  Problem Relation Age of Onset  . Heart disease Mother   . Heart disease Brother     Social History:  reports that she quit smoking about 56 years ago. Her smoking use included Cigarettes. She smoked 0.00 packs per day. She does not have any smokeless tobacco history on file. She reports that she does not drink alcohol or use illicit drugs.  Allergies:  Allergies  Allergen Reactions  . Celebrex [Celecoxib]     unknown  . Epinephrine     unsure  . Lidocaine     unsure  . Lyrica [Pregabalin]     Caused her to spit up excessively and became dehydrated  . Morphine And Related Other (See Comments)    hallucinations  . Sulfa Antibiotics     unsure  . Ultram [Tramadol Hcl] Other (See Comments)    Spit up and become dehydrated    Medications: I have reviewed the patient's current medications.  Results for orders placed during the hospital encounter of 07/21/13 (from the past 48 hour(s))  GLUCOSE, CAPILLARY     Status: Abnormal   Collection Time    07/23/13  8:19 AM      Result Value Range   Glucose-Capillary 143 (*) 70 - 99 mg/dL   Comment 1 Documented in Chart     Comment 2 Notify RN    BASIC METABOLIC PANEL     Status: Abnormal   Collection Time    07/23/13  9:45 AM      Result Value Range   Sodium 148 (*) 137 - 147 mEq/L   Potassium 2.8 (*) 3.7 - 5.3 mEq/L   Comment: CRITICAL RESULT CALLED TO, READ BACK BY AND VERIFIED WITH:     T.HICKS,RN 07/23/13 1034 BY BSLADE   Chloride 114 (*) 96 - 112 mEq/L   CO2 21  19 - 32 mEq/L   Glucose, Bld 154 (*) 70 - 99 mg/dL   BUN 21  6 - 23 mg/dL   Creatinine, Ser 3.04  0.50 - 1.10 mg/dL   Calcium 8.2 (*) 8.4 - 10.5 mg/dL   GFR calc non Af Amer 59 (*) >90 mL/min   GFR calc Af Amer 68 (*) >90 mL/min   Comment: (NOTE)     The eGFR has been calculated using the CKD EPI equation.     This calculation has not been validated in all clinical  situations.     eGFR's persistently <90 mL/min signify possible Chronic Kidney     Disease.  CBC     Status: Abnormal   Collection Time    07/23/13  9:45 AM      Result Value Range   WBC 15.7 (*) 4.0 - 10.5 K/uL   RBC 3.62 (*) 3.87 - 5.11 MIL/uL   Hemoglobin 10.6 (*) 12.0 - 15.0 g/dL   HCT 31.9 (*) 36.0 - 46.0 %   MCV 88.1  78.0 - 100.0 fL   MCH 29.3  26.0 - 34.0 pg   MCHC 33.2  30.0 - 36.0 g/dL   RDW 15.5  11.5 - 15.5 %   Platelets 460 (*) 150 - 400 K/uL  CBC     Status: Abnormal   Collection Time    07/24/13  5:02 AM      Result Value Range   WBC 10.7 (*) 4.0 - 10.5 K/uL   RBC 3.10 (*) 3.87 - 5.11 MIL/uL   Hemoglobin 8.9 (*) 12.0 - 15.0 g/dL   HCT 27.5 (*) 36.0 - 46.0 %   MCV 88.7  78.0 - 100.0 fL   MCH 28.7  26.0 - 34.0 pg   MCHC 32.4  30.0 - 36.0 g/dL   RDW 15.9 (*) 11.5 - 15.5 %   Platelets 410 (*) 150 - 400 K/uL  BASIC METABOLIC PANEL     Status: Abnormal   Collection Time    07/24/13  5:02 AM      Result Value Range   Sodium 143  137 - 147 mEq/L   Potassium 3.7  3.7 - 5.3 mEq/L   Comment: DELTA CHECK NOTED   Chloride 112  96 - 112 mEq/L   CO2 18 (*) 19 - 32 mEq/L   Glucose,  Bld 118 (*) 70 - 99 mg/dL   BUN 18  6 - 23 mg/dL   Creatinine, Ser 0.84  0.50 - 1.10 mg/dL   Calcium 7.5 (*) 8.4 - 10.5 mg/dL   GFR calc non Af Amer 59 (*) >90 mL/min   GFR calc Af Amer 68 (*) >90 mL/min   Comment: (NOTE)     The eGFR has been calculated using the CKD EPI equation.     This calculation has not been validated in all clinical situations.     eGFR's persistently <90 mL/min signify possible Chronic Kidney     Disease.  MAGNESIUM     Status: None   Collection Time    07/24/13  5:02 AM      Result Value Range   Magnesium 2.3  1.5 - 2.5 mg/dL    Dg Hip Complete Right  07/24/2013   CLINICAL DATA:  The patient is 2 weeks out from ORIF for a intertrochanteric -sub trochanteric fracture.  EXAM: RIGHT HIP - COMPLETE 2+ VIEW  COMPARISON:  None.  FINDINGS: The intramedullary rod and telescoping nail are demonstrated and appear to be in appropriate position. The fracture line remains visible. Minimal periosteal reaction may be present. There are surgical skin staples present.  IMPRESSION: There are post ORIF changes present. The fracture line remains visible but appears to be reasonably well aligned.   Electronically Signed   By: David  Martinique   On: 07/24/2013 15:16   Dg Swallowing Func-speech Pathology  07/24/2013   Houston Siren, CCC-SLP     07/24/2013 11:47 AM Objective Swallowing Evaluation: Modified Barium Swallowing Study   Patient Details  Name: [name] MRN: [id number] Date of  Birth: Jul 08, 1919  Today's Date: 07/24/2013 Time: 0940-1000 SLP Time Calculation (min): 20 min  Past Medical History:  Past Medical History  Diagnosis Date  . Hyperlipidemia   . Hypertension   . Arthritis   . Anxiety   . Asthma   . Leg pain     with walking  . Peripheral arterial disease   . Dementia    Past Surgical History:  Past Surgical History  Procedure Laterality Date  . Back surgery    . Femur im nail Right 07/09/2013    Procedure: INTRAMEDULLARY (IM) NAIL FEMORAL;  Surgeon: Johnn Hai, MD;   Location: [facility];  Service: Orthopedics;   Laterality: Right;   HPI:  80 y.o. female with Past medical history of hypertension,  dyslipidemia, peripheral vascular disease, dementia, recent hip  fracture repair and recent pneumonia.  The patient lives with a  caregiver at her group home.  Since she has been discharged she  has been having some cough which has gotten worse and lasting 4  days associated with shortness of breath. He also has noted that  since last few days her mentation has been progressively worse  and she is more lethargic. He denies any knowledge of vomiting or  diarrhea or signs of aspiration. Patient has completed course of  Levaquin, so mentions that they have been giving her dysphagia  type III diet.  CXR 1/3 revealed New bibasilar opacities may  reflect areas of atelectasis and/or consolidation, with  superimposed small bilateral pleural effusions.  CXR 12/25  Developing left lower lobe retrocardiac consolidation concerning  for pneumonia.     Assessment / Plan / Recommendation Clinical Impression  Dysphagia Diagnosis: Mild oral phase dysphagia;Moderate  pharyngeal phase dysphagia;Severe pharyngeal phase dysphagia Clinical impression: Pt. exhibited mild oral dysphagia evidenced  by delayed manipulation and transit.  Moderate-severe  sensorimotor dysphagia pharyngeal dysphagia marked by decreased  sensation with delayed initiation, reduced laryngeal elevation  leading to frank aspiration with honey liquids.  Reflexive cough  ineffective to clear aspirates.  Decreased tongue base retraction  and laryngeal elevation led to laryngeal penetration with nectar  barium, vallecular and pyriform sinus residue that reduced with  verbal cues to second swallow.  Any consistency thinner than  puree/pudding is unsafe for pt.  Uncertain as to chronic or acute  nature of dysphagia but suspect more chronic and exacerbated by  flu.  Recommending Dys 1 diet texture and pudding thick liquids.  Swallow function may  improve if strength/ endurance improves  (?).Would Palliative care consult be beneficial?     Treatment Recommendation  Therapy as outlined in treatment plan below    Diet Recommendation Dysphagia 1 (Puree);No liquids;Pudding-thick  liquid   Liquid Administration via: Spoon Medication Administration: Whole meds with puree Supervision: Full supervision/cueing for compensatory strategies Compensations: Small sips/bites;Slow rate;Multiple dry swallows  after each bite/sip Postural Changes and/or Swallow Maneuvers: Seated upright 90  degrees    Other  Recommendations Oral Care Recommendations: Oral care BID   Follow Up Recommendations   (TBD)    Frequency and Duration min 2x/week  2 weeks   Pertinent Vitals/Pain WDL         Reason for Referral Objectively evaluate swallowing function   Oral Phase Oral Preparation/Oral Phase Oral Phase: Impaired Oral - Honey Oral - Honey Teaspoon: Delayed oral transit;Reduced posterior  propulsion;Weak lingual manipulation Oral - Honey Cup: Delayed oral transit;Reduced posterior  propulsion;Weak lingual manipulation Oral - Nectar Oral - Nectar Teaspoon: Delayed oral transit;Reduced posterior  propulsion;Weak lingual manipulation Oral - Solids Oral - Puree: Delayed oral transit;Reduced posterior  propulsion;Weak lingual manipulation   Pharyngeal Phase Pharyngeal Phase Pharyngeal Phase: Impaired Pharyngeal - Honey Pharyngeal - Honey Teaspoon: Delayed swallow initiation;Premature  spillage to valleculae;Pharyngeal residue - valleculae;Reduced  tongue base retraction;Pharyngeal residue - pyriform  sinuses;Reduced laryngeal elevation;Penetration/Aspiration during  swallow Penetration/Aspiration details (honey teaspoon): Material enters  airway, remains ABOVE vocal cords and not ejected out Pharyngeal - Honey Cup: Delayed swallow initiation;Premature  spillage to valleculae;Penetration/Aspiration during  swallow;Pharyngeal residue - pyriform sinuses;Pharyngeal residue  - valleculae;Reduced  tongue base retraction;Reduced laryngeal  elevation Penetration/Aspiration details (honey cup): Material enters  airway, passes BELOW cords and not ejected out despite cough  attempt by patient (frank aspiration) Pharyngeal - Nectar Pharyngeal - Nectar Teaspoon: Delayed swallow  initiation;Premature spillage to pyriform sinuses;Premature  spillage to valleculae;Penetration/Aspiration during  swallow;Pharyngeal residue - valleculae;Pharyngeal residue -  pyriform sinuses;Reduced tongue base retraction;Reduced laryngeal  elevation Penetration/Aspiration details (nectar teaspoon): Material enters  airway, remains ABOVE vocal cords and not ejected out  (questionable aspiration) Pharyngeal - Nectar Cup: Not tested Pharyngeal - Solids Pharyngeal - Puree: Delayed swallow initiation;Premature spillage  to valleculae  Cervical Esophageal Phase    GO    Cervical Esophageal Phase Cervical Esophageal Phase: Darryll Capers         Cranford Mon.Ed CCC-SLP Pager [phone]  07/24/2013    Review of Systems  Constitutional: Negative.   HENT: Negative.   Eyes: Negative.   Respiratory: Negative.   Cardiovascular: Positive for chest pain.  Gastrointestinal: Negative.   Genitourinary: Negative.   Musculoskeletal: Negative.   Skin: Negative.   Neurological: Negative.   Psychiatric/Behavioral: Negative.    Blood pressure 175/59, pulse 76, temperature 97.4 F (36.3 C), temperature source Axillary, resp. rate 16, height $RemoveBe'5\' 3"'EDVAChWVe$  (1.6 m), weight 46.267 kg (102 lb), SpO2 97.00%. Physical Exam  Constitutional: She is oriented to person, place, and time. She appears well-developed and well-nourished.  HENT:  Head: Normocephalic and atraumatic.  Eyes: Conjunctivae and EOM are normal. Pupils are equal, round, and reactive to light.  Neck: Normal range of motion. Neck supple.  Cardiovascular: Normal rate and regular rhythm.   Respiratory: Effort normal.  GI: Soft. Bowel sounds are normal.  Musculoskeletal:  Right hip incisions  healing well, staples removed, steris placed. Nontender. No erythema or drainage, no sign of infx. No pain with gentle ROM R hip. No calf pain. NVI distally.  Neurological: She is alert and oriented to person, place, and time. She has normal reflexes.  Skin: Skin is warm and dry.  Psychiatric: She has a normal mood and affect.   xrays R hip with no shift in alignment R IT hip fx, IM nail placement stable. Slight settling at the fx site which does not affect the fx alignment.  Assessment/Plan: 2 weeks s/p IM nail R femur, doing well  Staples already removed, steris placed, incisions healing well Allow steris to fall off on their own Continue PWB RLE (50%) for another 4 weeks (6 weeks total) Continue PT ASA for DVT ppx Follow up in 4 weeks as an outpt with Dr. Tonita Cong for repeat xrays at which point plan to progress her weightbearing Will discuss with Dr. Tonita Cong Will call son Doren Custard) this AM as he requested  Lacie Draft M. 07/25/2013, 7:58 AM

## 2013-07-25 NOTE — Clinical Social Work Placement (Signed)
Clinical Social Work Department CLINICAL SOCIAL WORK PLACEMENT NOTE 07/25/2013  Patient:  [name],Kendra S  Account Number:  [id number] Admit date:  07/21/2013  Clinical Social Worker:  Genelle BalVANESSA Tim Wilhide, LCSW  Date/time:  07/25/2013 02:55 AM  Clinical Social Work is seeking post-discharge placement for this patient at the following level of care:   SKILLED NURSING   (*CSW will update this form in Epic as items are completed)   07/24/2013  Patient/family provided with [facility] of Clinical Social Work's list of facilities offering this level of care within the geographic area requested by the patient (or if unable, by the patient's family).  07/24/2013  Patient/family informed of their freedom to choose among providers that offer the needed level of care, that participate in Medicare, Medicaid or managed care program needed by the patient, have an available bed and are willing to accept the patient.    Patient/family informed of [facility]' ownership interest in [facility], as well as of the fact that they are under no obligation to receive care at this facility.  PASARR submitted to EDS on 04/09/08 PASARR number received from EDS on 04/09/08  FL2 transmitted to all facilities in geographic area requested by pt/family on  07/24/2013 FL2 transmitted to all facilities within larger geographic area on   Patient informed that his/her managed care company has contracts with or will negotiate with  certain facilities, including the following:     Patient/family informed of bed offers received:  07/25/2013 [facility] Physician recommends and patient chooses bed at    [facility]  07/25/2013 Patient to be transferred to facility by ambulance  The following physician request were entered in Epic:   Additional Comments: 07/25/13 - Son's initial SNF choice was [facility], however CSW advised by admissions staff that they would not be able to accept patient today due to high volume of admissions; but could accept her Thursday. CSW informed son that MD had d/c'd patient and [facility]

## 2013-07-25 NOTE — Discharge Instructions (Signed)
Partial weightbearing right leg (50%) Aspirin 325mg  daily for DVT ppx Follow up with Dr. Shelle IronBeane in 4 weeks for repeat xrays- call to make appt

## 2013-07-25 NOTE — Discharge Summary (Signed)
PATIENT DETAILS Name: [name] Age: 80 y.o. Sex: female Date of Birth: 02-27-1919 MRN: [id number]. Admit Date: 07/21/2013 Admitting Physician: Lynden Oxford, MD WUJ:WJXB, [facility], DO  Recommendations for Outpatient Follow-up:  1. If patient deteriorates further, consider full comfort measures and residential hospice placement 2. Will need hospice/palliative care followup while at skilled nursing facility placement 3. On dysphagia 1 diet, will need speech therapy followup in the near future 4. Overall very poor prognosis-patient's son willing to  hospice care if further deterioration 5. Will need full assistance with feeding at all times 6. She will need a repeat chest x-ray to be done in 4-6 weeks to document resolution of the infiltrate.  PRIMARY DISCHARGE DIAGNOSIS:  Principal Problem:   HCAP (healthcare-associated pneumonia) Active Problems:   Influenza   Hyperlipidemia   Hypertension   Alzheimer's dementia   Asthma   Closed right hip fracture   Weakness generalized      PAST MEDICAL HISTORY: Past Medical History  Diagnosis Date  . Hyperlipidemia   . Hypertension   . Arthritis   . Anxiety   . Asthma   . Leg pain     with walking  . Peripheral arterial disease   . Dementia     DISCHARGE MEDICATIONS:   Medication List    STOP taking these medications       guaiFENesin 600 MG 12 hr tablet  Commonly known as:  MUCINEX  Replaced by:  guaiFENesin 100 MG/5ML Soln      TAKE these medications       acetaminophen 325 MG tablet  Commonly known as:  TYLENOL  Take 2 tablets (650 mg total) by mouth every 6 (six) hours as needed for mild pain or fever.     albuterol (2.5 MG/3ML) 0.083% nebulizer solution  Commonly known as:  PROVENTIL  Take 3 mLs (2.5 mg total) by nebulization every 6 (six) hours as needed for wheezing or shortness of breath.     ALPRAZolam 0.25 MG tablet  Commonly known as:  XANAX  Take 1-2 tablets (0.25-0.5 mg total) by mouth 3 (three) times  daily as needed for anxiety.     amLODipine 10 MG tablet  Commonly known as:  NORVASC  Take 1 tablet (10 mg total) by mouth daily.     aspirin EC 325 MG tablet  Take 1 tablet (325 mg total) by mouth daily.     cloNIDine 0.3 MG tablet  Commonly known as:  CATAPRES  Take 1 tablet (0.3 mg total) by mouth 3 (three) times daily.     feeding supplement (ENSURE) Pudg  Take 1 Container by mouth 3 (three) times daily between meals.     fluticasone 50 MCG/ACT nasal spray  Commonly known as:  FLONASE  Place 1 spray into both nostrils daily as needed for allergies or rhinitis.     food thickener Powd  Commonly known as:  THICK IT  Take by mouth as needed     guaiFENesin 100 MG/5ML Soln  Commonly known as:  ROBITUSSIN  Take 10 mLs (200 mg total) by mouth every 4 (four) hours.     hydrALAZINE 50 MG tablet  Commonly known as:  APRESOLINE  Take 1.5 tablets (75 mg total) by mouth 4 (four) times daily.     levothyroxine 75 MCG tablet  Commonly known as:  SYNTHROID, LEVOTHROID  Take 1 tablet (75 mcg total) by mouth 1 day or 1 dose.     moxifloxacin 400 MG tablet  Commonly known as:  AVELOX  Take 1 tablet (400 mg total) by mouth daily at 8 pm.     oseltamivir 30 MG capsule  Commonly known as:  TAMIFLU  Take 1 capsule (30 mg total) by mouth daily. Take for one more day from 07/25/13 and then stop     oxyCODONE-acetaminophen 5-325 MG per tablet  Commonly known as:  PERCOCET  Take 1-2 tablets by mouth every 4 (four) hours as needed.     polyethylene glycol packet  Commonly known as:  MIRALAX / GLYCOLAX  Take 17 g by mouth daily as needed for mild constipation.     RESOURCE THICKENUP CLEAR Powd  Take 120 g by mouth as needed (nectar thick liquids.).        ALLERGIES:   Allergies  Allergen Reactions  . Celebrex [Celecoxib]     unknown  . Epinephrine     unsure  . Lidocaine     unsure  . Lyrica [Pregabalin]     Caused her to spit up excessively and became dehydrated  .  Morphine And Related Other (See Comments)    hallucinations  . Sulfa Antibiotics     unsure  . Ultram [Tramadol Hcl] Other (See Comments)    Spit up and become dehydrated    BRIEF HPI:  See H&P, Labs, Consult and Test reports for all details in brief, patient is a 80 year old female with as medical history of hypertension, dyslipidemia, and peripheral vascular disease who recently underwent a fracture repair brought in the hospital on 07/21/13 for worsening cough, shortness of breath and confusion. She was then admitted for further evaluation and treatment.  CONSULTATIONS:   general surgery  PERTINENT RADIOLOGIC STUDIES: Dg Hip Complete Right  07/24/2013   CLINICAL DATA:  The patient is 2 weeks out from ORIF for a intertrochanteric -sub trochanteric fracture.  EXAM: RIGHT HIP - COMPLETE 2+ VIEW  COMPARISON:  None.  FINDINGS: The intramedullary rod and telescoping nail are demonstrated and appear to be in appropriate position. The fracture line remains visible. Minimal periosteal reaction may be present. There are surgical skin staples present.  IMPRESSION: There are post ORIF changes present. The fracture line remains visible but appears to be reasonably well aligned.   Electronically Signed   By: David  [place]   On: 07/24/2013 15:16   Dg Hip Complete Right  07/09/2013   CLINICAL DATA:  Right hip fracture.  EXAM: RIGHT HIP - COMPLETE 2+ VIEW  COMPARISON:  None.  FINDINGS: Acute intertrochanteric fracture of the proximal right femur present with displacement, angulation and impaction. No dislocation identified. No other pelvic fractures are seen.  IMPRESSION: Acute intertrochanteric right hip fracture.   Electronically Signed   By: Irish Lack M.D.   On: 07/09/2013 13:00   Dg Hip Operative Right  07/09/2013   CLINICAL DATA:  ORIF of the right hip.  EXAM: DG OPERATIVE RIGHT HIP  TECHNIQUE: A single spot fluoroscopic AP image of the right hip is submitted.  COMPARISON:  None.  FINDINGS: Eight  fluoroscopic spot films demonstrate gamma nail placement with short intra medullary nail. The IM nail has a distal interlocking screw.  IMPRESSION: Gamma nail fixation of the right intertrochanteric femur fracture.   Electronically Signed   By: Andreas Newport M.D.   On: 07/09/2013 20:24   Dg Pelvis Portable  07/09/2013   CLINICAL DATA:  Postop ORIF of right hip fracture  EXAM: PORTABLE PELVIS 1-2 VIEWS  COMPARISON:  AP pelvis radiographs-earlier same day  FINDINGS: Provided image demonstrates  the sequela of intra medullary rod fixation of the proximal femur and dynamic screw fixation of the right femoral neck. Note, the caudal end of the right femoral intra medullary rod is excluded from view.  There is a persistently displaced avulsion fracture of the right lesser trochanter. Otherwise, there is near anatomic alignment given obliquity.  There are expected foci of subcutaneous emphysema about the operative site. Adjacent skin staples. Vascular calcifications.  IMPRESSION: Post ORIF of the right hip and femur without definite evidence of complication.   Electronically Signed   By: Simonne Come M.D.   On: 07/09/2013 21:31   Dg Pelvis Portable  07/09/2013   CLINICAL DATA:  Fall. Obvious right hip deformity at physical examination.  EXAM: PORTABLE PELVIS 1-2 VIEWS  COMPARISON:  None.  FINDINGS: Comminuted intertrochanteric right femoral neck fracture. Right hip joint anatomically aligned with marked joint space narrowing. Remainder of the pelvis not optimally imaged.  IMPRESSION: Comminuted intertrochanteric right femoral neck fracture.   Electronically Signed   By: Hulan Saas M.D.   On: 07/09/2013 10:30   Dg Chest Portable 1 View  07/21/2013   CLINICAL DATA:  Shortness of breath.  EXAM: PORTABLE CHEST - 1 VIEW  COMPARISON:  Chest x-ray 07/12/2013.  FINDINGS: Bibasilar opacities may reflect areas of atelectasis and/or consolidation, likely with superimposed small bilateral pleural effusions. No evidence  of pulmonary edema. Heart size appears borderline enlarged. The patient is rotated to the left on today's exam, resulting in distortion of the mediastinal contours and reduced diagnostic sensitivity and specificity for mediastinal pathology. Atherosclerosis in the thoracic aorta.  IMPRESSION: 1. New bibasilar opacities may reflect areas of atelectasis and/or consolidation, with superimposed small bilateral pleural effusions. 2. Atherosclerosis.   Electronically Signed   By: Trudie Reed M.D.   On: 07/21/2013 20:00   Dg Chest Port 1 View  07/12/2013   CLINICAL DATA:  Cough  EXAM: PORTABLE CHEST - 1 VIEW  COMPARISON:  07/09/2013  FINDINGS: Mild cardiomegaly without CHF or edema. Mild hyperinflation, suspect COPD/emphysema. Right lung clear. Left lower lobe retrocardiac consolidation noted, pneumonia not excluded. Question small left effusion. No pneumothorax. Degenerative changes of the spine with an associated scoliosis. Atherosclerosis of the aorta present.  IMPRESSION: Developing left lower lobe retrocardiac consolidation concerning for pneumonia.   Electronically Signed   By: Ruel Favors M.D.   On: 07/12/2013 12:18   Dg Chest Portable 1 View  07/09/2013   CLINICAL DATA:  Fall  EXAM: PORTABLE CHEST - 1 VIEW  COMPARISON:  05/20/2012  FINDINGS: Cardiomediastinal silhouette is stable. Hyperinflation. Mild degenerative changes thoracic spine. Atherosclerotic calcifications of thoracic aorta. No acute infiltrate or pulmonary edema.  IMPRESSION: No active disease.  Hyperinflation.   Electronically Signed   By: Natasha Mead M.D.   On: 07/09/2013 11:20   Dg Swallowing Func-speech Pathology  07/24/2013   Breck Coons [place], CCC-SLP     07/24/2013 11:47 AM Objective Swallowing Evaluation: Modified Barium Swallowing Study   Patient Details  Name: SANIA NOY MRN: [id number] Date of Birth: Jan 09, 1919  Today's Date: 07/24/2013 Time: 0940-1000 SLP Time Calculation (min): 20 min  Past Medical History:  Past Medical  History  Diagnosis Date  . Hyperlipidemia   . Hypertension   . Arthritis   . Anxiety   . Asthma   . Leg pain     with walking  . Peripheral arterial disease   . Dementia    Past Surgical History:  Past Surgical History  Procedure Laterality Date  .  Back surgery    . Femur im nail Right 07/09/2013    Procedure: INTRAMEDULLARY (IM) NAIL FEMORAL;  Surgeon: Javier Docker, MD;  Location: [facility] OR;  Service: Orthopedics;   Laterality: Right;   HPI:  80 y.o. female with Past medical history of hypertension,  dyslipidemia, peripheral vascular disease, dementia, recent hip  fracture repair and recent pneumonia.  The patient lives with a  caregiver at her group home.  Since she has been discharged she  has been having some cough which has gotten worse and lasting 4  days associated with shortness of breath. He also has noted that  since last few days her mentation has been progressively worse  and she is more lethargic. He denies any knowledge of vomiting or  diarrhea or signs of aspiration. Patient has completed course of  Levaquin, so mentions that they have been giving her dysphagia  type III diet.  CXR 1/3 revealed New bibasilar opacities may  reflect areas of atelectasis and/or consolidation, with  superimposed small bilateral pleural effusions.  CXR 12/25  Developing left lower lobe retrocardiac consolidation concerning  for pneumonia.     Assessment / Plan / Recommendation Clinical Impression  Dysphagia Diagnosis: Mild oral phase dysphagia;Moderate  pharyngeal phase dysphagia;Severe pharyngeal phase dysphagia Clinical impression: Pt. exhibited mild oral dysphagia evidenced  by delayed manipulation and transit.  Moderate-severe  sensorimotor dysphagia pharyngeal dysphagia marked by decreased  sensation with delayed initiation, reduced laryngeal elevation  leading to frank aspiration with honey liquids.  Reflexive cough  ineffective to clear aspirates.  Decreased tongue base retraction  and laryngeal elevation led to  laryngeal penetration with nectar  barium, vallecular and pyriform sinus residue that reduced with  verbal cues to second swallow.  Any consistency thinner than  puree/pudding is unsafe for pt.  Uncertain as to chronic or acute  nature of dysphagia but suspect more chronic and exacerbated by  flu.  Recommending Dys 1 diet texture and pudding thick liquids.  Swallow function may improve if strength/ endurance improves  (?).Would Palliative care consult be beneficial?     Treatment Recommendation  Therapy as outlined in treatment plan below    Diet Recommendation Dysphagia 1 (Puree);No liquids;Pudding-thick  liquid   Liquid Administration via: Spoon Medication Administration: Whole meds with puree Supervision: Full supervision/cueing for compensatory strategies Compensations: Small sips/bites;Slow rate;Multiple dry swallows  after each bite/sip Postural Changes and/or Swallow Maneuvers: Seated upright 90  degrees    Other  Recommendations Oral Care Recommendations: Oral care BID   Follow Up Recommendations   (TBD)    Frequency and Duration min 2x/week  2 weeks   Pertinent Vitals/Pain WDL         Reason for Referral Objectively evaluate swallowing function   Oral Phase Oral Preparation/Oral Phase Oral Phase: Impaired Oral - Honey Oral - Honey Teaspoon: Delayed oral transit;Reduced posterior  propulsion;Weak lingual manipulation Oral - Honey Cup: Delayed oral transit;Reduced posterior  propulsion;Weak lingual manipulation Oral - Nectar Oral - Nectar Teaspoon: Delayed oral transit;Reduced posterior  propulsion;Weak lingual manipulation Oral - Solids Oral - Puree: Delayed oral transit;Reduced posterior  propulsion;Weak lingual manipulation   Pharyngeal Phase Pharyngeal Phase Pharyngeal Phase: Impaired Pharyngeal - Honey Pharyngeal - Honey Teaspoon: Delayed swallow initiation;Premature  spillage to valleculae;Pharyngeal residue - valleculae;Reduced  tongue base retraction;Pharyngeal residue - pyriform  sinuses;Reduced  laryngeal elevation;Penetration/Aspiration during  swallow Penetration/Aspiration details (honey teaspoon): Material enters  airway, remains ABOVE vocal cords and not ejected out Pharyngeal - Honey Cup: Delayed swallow initiation;Premature  spillage to valleculae;Penetration/Aspiration during  swallow;Pharyngeal residue - pyriform sinuses;Pharyngeal residue  - valleculae;Reduced tongue base retraction;Reduced laryngeal  elevation Penetration/Aspiration details (honey cup): Material enters  airway, passes BELOW cords and not ejected out despite cough  attempt by patient (frank aspiration) Pharyngeal - Nectar Pharyngeal - Nectar Teaspoon: Delayed swallow  initiation;Premature spillage to pyriform sinuses;Premature  spillage to valleculae;Penetration/Aspiration during  swallow;Pharyngeal residue - valleculae;Pharyngeal residue -  pyriform sinuses;Reduced tongue base retraction;Reduced laryngeal  elevation Penetration/Aspiration details (nectar teaspoon): Material enters  airway, remains ABOVE vocal cords and not ejected out  (questionable aspiration) Pharyngeal - Nectar Cup: Not tested Pharyngeal - Solids Pharyngeal - Puree: Delayed swallow initiation;Premature spillage  to valleculae  Cervical Esophageal Phase    GO    Cervical Esophageal Phase Cervical Esophageal Phase: Leonarda Salon         Darrow Bussing.Ed CCC-SLP Pager [phone]  07/24/2013     PERTINENT LAB RESULTS: CBC:  Recent Labs  07/23/13 0945 07/24/13 0502  WBC 15.7* 10.7*  HGB 10.6* 8.9*  HCT 31.9* 27.5*  PLT 460* 410*   CMET CMP     Component Value Date/Time   NA 143 07/24/2013 0502   K 3.7 07/24/2013 0502   CL 112 07/24/2013 0502   CO2 18* 07/24/2013 0502   GLUCOSE 118* 07/24/2013 0502   BUN 18 07/24/2013 0502   CREATININE 0.84 07/24/2013 0502   CALCIUM 7.5* 07/24/2013 0502   PROT 6.1 07/22/2013 0700   ALBUMIN 2.2* 07/22/2013 0700   AST 18 07/22/2013 0700   ALT 10 07/22/2013 0700   ALKPHOS 93 07/22/2013 0700   BILITOT 0.7 07/22/2013 0700   GFRNONAA 59*  07/24/2013 0502   GFRAA 68* 07/24/2013 0502    GFR Estimated Creatinine Clearance: 31.2 ml/min (by C-G formula based on Cr of 0.84). No results found for this basename: LIPASE, AMYLASE,  in the last 72 hours No results found for this basename: CKTOTAL, CKMB, CKMBINDEX, TROPONINI,  in the last 72 hours No components found with this basename: POCBNP,  No results found for this basename: DDIMER,  in the last 72 hours No results found for this basename: HGBA1C,  in the last 72 hours No results found for this basename: CHOL, HDL, LDLCALC, TRIG, CHOLHDL, LDLDIRECT,  in the last 72 hours No results found for this basename: TSH, T4TOTAL, FREET3, T3FREE, THYROIDAB,  in the last 72 hours No results found for this basename: VITAMINB12, FOLATE, FERRITIN, TIBC, IRON, RETICCTPCT,  in the last 72 hours Coags: No results found for this basename: PT, INR,  in the last 72 hours Microbiology: Recent Results (from the past 240 hour(s))  CULTURE, BLOOD (ROUTINE X 2)     Status: None   Collection Time    07/22/13 12:05 AM      Result Value Range Status   Specimen Description BLOOD RIGHT UPPER ARM   Final   Special Requests BOTTLES DRAWN AEROBIC ONLY 5CC   Final   Culture  Setup Time     Final   Value: 07/22/2013 13:49     Performed at [facility]   Culture     Final   Value:        BLOOD CULTURE RECEIVED NO GROWTH TO DATE CULTURE WILL BE HELD FOR 5 DAYS BEFORE ISSUING A FINAL NEGATIVE REPORT     Performed at [facility]   Report Status PENDING   Incomplete  CULTURE, BLOOD (ROUTINE X 2)     Status: None   Collection Time  07/22/13  1:46 AM      Result Value Range Status   Specimen Description BLOOD BLOOD RIGHT FOREARM   Final   Special Requests BOTTLES DRAWN AEROBIC ONLY 3CC   Final   Culture  Setup Time     Final   Value: 07/22/2013 13:49     [facility]   Culture     Final   Value:        BLOOD CULTURE RECEIVED NO GROWTH TO DATE CULTURE WILL BE HELD FOR 5  DAYS BEFORE ISSUING A FINAL NEGATIVE REPORT     [facility]formed at [facility]   Report Status PENDING   Incomplete     BRIEF HOSPITAL COURSE:  HCAP (healthcare-associated pneumonia) vs Aspiration PNA - Patient was admitted, given overall clinical feature, aspiration pneumonia was thought to be very likely. However she was recently hospitalized and just discharged after a hip fracture repair, as a result she was treated empirically with vancomycin and cefepime. Speech therapy evaluation was done, current recommendations are to continue with a dysphagia 1 diet. This recommendation was discussed with the patient's son Margarita Mailhillip Hoffman, who accepted all risks of aspiration and wanted to continue with a dysphagia 1 diet. Patient has now remained persistently afebrile, leukocytosis has resolved, on discharge she will be transitioned to Avelox to continue for another 6 more days. She will need a repeat chest x-ray to be done in 4-6 weeks to document resolution of the infiltrate.  Influenza - On admission, influenza PCR was done which is positive for influenza A. She was started on Tamiflu, she has improved significantly since on admission, she will be continued on Tamiflu for one more day from 07/25/13. - Breathing is significantly improved, she however still has some issues with secretions, she will be placed on nebulized bronchodilators, Mucinex on discharge. Her lungs although still with some scattered coarse rhonchi are significantly better than on admission.  Acute Hypoxic Resp Failure  -2/2 to above  -better with Antibiotics/Tamiflu -prn oxygen as needed - She'll be on scheduled nebulized bronchodilators on discharge  Encephalopathy  - Secondary to above  - Better with IV antibiotics,lethargic but clearly better than the past few days, very close to her usual baseline  Recent hip fracture - Seen by orthopedics during this admission. Continue with aspirin on discharge. Per orthopedics,  staples have been removed, allow steris to fall off on their own. -Continue PWB RLE (50%) for another 4 weeks (6 weeks total) .Continue PT while at skilled nursing facility -ASA for DVT ppx  -Follow up in 4 weeks as an outpt with Dr. Shelle IronBeane for repeat xrays at which point plan to progress her weightbearing  Hypernatremia  - Secondary to poor oral intake  - resolved with IV fluids, and improvement in her mental status  Hypertension - With clonidine, and amlodipine. Both these medications dosages have changed-please see above. Had increase hydralazine to 75 mg 2 times a day. Further optimization will need to be done by the attending M.D. at the skilled nursing facility.  Hypokalemia  -resolved, recheck periodically   Hypothyroidism  - Continue levothyroxin   Alzheimer's dementia  - Watch out for delirium   Severe Malnutrition  -c/w supplements  Palliative care/Ethics/end-of-life issues - DO NOT RESUSCITATE - Patient's son-Phillip Hoffman does not desire any aggressive measures-if patient were to deteriorate in the future, he is open to transitioning to a full comfort measures with residential hospice placement. - No artificial feeding in the form of NG/PEG  tube now or in the future   TODAY-DAY OF DISCHARGE:  Subjective:   Chey Rachels today continues to improve, her mentation continues to improve. She is able to follow some commands. He does not appear to be in any distress. Objective:   Blood pressure 175/59, pulse 76, temperature 97.4 F (36.3 C), temperature source Axillary, resp. rate 16, height 5\' 3"  (1.6 m), weight 46.267 kg (102 lb), SpO2 97.00%.  Intake/Output Summary (Last 24 hours) at 07/25/13 0951 Last data filed at 07/25/13 0535  Gross per 24 hour  Intake     90 ml  Output    600 ml  Net   -510 ml   Filed Weights   07/21/13 2225 07/23/13 2055 07/24/13 2029  Weight: 42.9 kg (94 lb 9.2 oz) 47.58 kg (104 lb 14.3 oz) 46.267 kg (102 lb)    Exam Awake Alert,  Oriented *3, No new F.N deficits, Normal affect [place].AT,PERRAL Supple Neck,No JVD, No cervical lymphadenopathy appriciated.  Symmetrical Chest wall movement, Good air movement bilaterally, scattered coarse rhonchi, transmitted upper airway sounds from secretions RRR,No Gallops,Rubs or new Murmurs, No Parasternal Heave +ve B.Sounds, Abd Soft, Non tender, No organomegaly appriciated, No rebound -guarding or rigidity. No Cyanosis, Clubbing or edema, No new Rash or bruise  DISCHARGE CONDITION: Stable  DISPOSITION: SNF  DISCHARGE INSTRUCTIONS:    Activity:  As tolerated with Full fall precautions use walker/cane & assistance as needed  Diet recommendation: Heart Healthy diet but Dysphagia 1 diet      Discharge Orders   Future Appointments Provider Department Dept Phone   11/26/2013 3:30 PM Kermit Balo, DO [facility] [phone]   Future Orders Complete By Expires   Call MD for:  difficulty breathing, headache or visual disturbances  As directed    Call MD for:  temperature >100.4  As directed    Diet - low sodium heart healthy  As directed    Scheduling Instructions:     Dysphagia 1 diet   Increase activity slowly  As directed       Follow-up Information   Follow up with BEANE,JEFFREY C, MD In 4 weeks.   Specialty:  Orthopedic Surgery   Contact information:   [address] [place] [address] [phone]       Follow up with REED, TIFFANY, DO. Schedule an appointment as soon as possible for a visit in 1 week.   Specialty:  Geriatric Medicine   Contact information:   [address] [place] [address] [phone]      Total Time spent on discharge equals 45 minutes.  SignedJeoffrey Massed 07/25/2013 9:51 AM

## 2013-07-25 NOTE — Clinical Social Work Psychosocial (Signed)
Clinical Social Work Department BRIEF PSYCHOSOCIAL ASSESSMENT 07/25/2013  Patient:  [name],[name]     Account Number:  [id number]     Admit date:  07/21/2013  Clinical Social Worker:  Delmer IslamRAWFORD,Liana Camerer, LCSW  Date/Time:  07/25/2013 02:43 AM  Referred by:  Physician  Date Referred:  07/24/2013 Referred for  SNF Placement   Other Referral:   Interview type:  Family Other interview type:   CSW talked with son [name] [phone]) regarding d/c planning    PSYCHOSOCIAL DATA Living Status:  OTHER Admitted from facility:   Level of care:   Primary support name:  [name] Primary support relationship to patient:  CHILD, ADULT Degree of support available:   Patient has another adult son sho is involved    CURRENT CONCERNS Current Concerns  Post-Acute Placement   Other Concerns:    SOCIAL WORK ASSESSMENT / PLAN On 07/24/13 CSW and nurse case manager Elnita Maxwellheryl Royal talked with son regarding discharge plans and [facility]. Mr. [name] explained that patient lives with a person who is a retired LawyerCNA and cares for patient. Son advised that MD is recommending SNF, and during our conversation the MD came to speak with son and reiterated patient'[name] need for SNF and rehab. Mr. [name] was agreeable and [facility]. Son reported that patient has been to rehab before a few years ago.   Assessment/plan status:  Psychosocial Support/Ongoing Assessment of Needs Other assessment/ plan:   Information/referral to community resources:   [facility] to son on 07/24/13    PATIENT'[name]/FAMILY'[name] RESPONSE TO PLAN OF CARE: Son very concerned about patient getting appropriate care. CSW provided support and answered his questions. Mr. [name] indicated that he will talk with his brother regarding SNF preferences.

## 2013-07-28 LAB — CULTURE, BLOOD (ROUTINE X 2)
Culture: NO GROWTH
Culture: NO GROWTH

## 2013-08-02 ENCOUNTER — Ambulatory Visit: Payer: Medicare Other | Admitting: Nurse Practitioner

## 2013-08-02 ENCOUNTER — Telehealth: Payer: Self-pay | Admitting: *Deleted

## 2013-08-02 NOTE — Telephone Encounter (Signed)
Spoke with Dr. Renato Gailseed and she agreed with the [facility]. Called [name] with Hospice and gave her verbal ok and also called the son, Aneta Minshillip.

## 2013-08-02 NOTE — Telephone Encounter (Signed)
Son, [name], and Hospice Nurse, Stasia CavalierEvette, called stating that patient is being discharged from [facility] today and needs an order for Hospice nurse to come out and help tomorrow. Also wants to make sure you agree to be attending physician. Please Advise.

## 2013-08-13 NOTE — Progress Notes (Signed)
     Rm # [id number]  [facility]  Patient ID: [name], female   DOB: 08/02/1918, 80 y.o.   MRN: [id number] /03/2013  Allergies: Statins Sulfa antibiotics Oxycodone Morphine Codeine Aspirin  Chief Complaint  Patient presents with  . Discharge Note   HPI:  Pt is s/p hysterectomy r/t severe vaginal bleeding.  Has been receiving PT/OT, for mobility, safety, and strengthening Now ready for discharge.    Review of Systems  Constitutional: Negative for chills and weight loss.  HENT: Negative for ear discharge, ear pain and nosebleeds.   Eyes: Negative for pain, discharge and redness.  Cardiovascular: Negative for orthopnea.  Gastrointestinal: Negative for constipation and blood in stool.  Genitourinary: Negative for hematuria and flank pain.  Skin: Negative for itching and rash.       Positive for mid abdominal vertical surgical incision  Neurological: Negative for seizures and loss of consciousness.  Psychiatric/Behavioral: Positive for memory loss. Negative for suicidal ideas.     Medical history: Post menopausal bleeding, mass Hypertension Diabetes Lipid metabolism disorder  Medications:  Tylenol extra strength, 500 mg one tablet every 4 hours as needed for pain. Phenergan 25 mg one tablet every 6 hours for nausea and vomiting Metamucil 1 tablespoon 3 times a day and fluid Dulcolax suppository rectally daily as needed for constipation Mylanta 30 mL every 4 hours when necessary for indigestion. Allopurinol 100 mg one tablet each day with supper  Alphagan solution 0.1%, one drop in her left eye twice a day Cosopt 1 drop in both eyes twice a day Zestoretic 20/12.5 one tablet at bedtime Nevanac solution 0.1% 3 drops in the left eye one hour before bedtime Tofranil 50 mg at bedtime Xalatan 0.005% one drop in each eye at bedtime Glucophage 500 mg one tablet twice a day  Levothyroxine 75 mcg, one tablet each day  Vital signs: 109/65, pulse 87-98 2, respiratory rate  20  Progress note for facility discharge:  The patient is status post hysterectomy, for postmenopausal bleeding.  The patient is a 80 year old African American female who is going to be discharged from the facility. She had a midline vertical incision for a hysterectomy. She has been receiving physical therapy while in the facility.  Review of systems: Patient states she is due for an eye exam, see her ophthalmologist after facility discharge.  Physical Examination Oriented x3. Alert and verbally appropriate, in NAD EOMI, PERRLA External ears unremarkable No clearly palpable cervical adenopathy No thyromegaly AP with RRR Abdomen soft and undistended Mid line incision without any evidence of infection, bleeding, or inflammation. No significant lower extremity edema  Impression/Plan:   Patient ready for discharge, will need in-home care, to continue therapy for strength building and safety with ambulation. Patient is certainly at risk for fall. She'll need ongoing monitoring of her diabetes, and of her surgical incision.  Constipation, will need to be carefully monitored, continue Metamucil 1 tablespoon 3 times a day with fluid, may also use a Dulcolax supposityor daily as neeced.  Hypertension, continue Zestoretic 20/12.5, 1 tab at bedtime  Gout, continue 100 mg Allopurinol each day with supper  Glaucoma, continue eye medications, including Nevanca solution 0.1%, Xalatan 0.005 %, and Cosopt   Hypothyroidism, 75 mcg of Synthroid each day  Diabetes, Metformin 500 mg twice a day, will be important to monitor CBG and renal function  She will also have assistance from her family.

## 2013-08-19 DEATH — deceased

## 2013-09-28 ENCOUNTER — Ambulatory Visit: Payer: Medicare Other | Admitting: Internal Medicine

## 2013-11-26 ENCOUNTER — Ambulatory Visit: Payer: Self-pay | Admitting: Internal Medicine

## 2014-05-09 IMAGING — CR DG CHEST 1V PORT
1 series · 1 of 1 positions shown · non-contrast
Comparison: Chest x-ray 07/12/2013.

CLINICAL DATA: Shortness of breath.

EXAM:
PORTABLE CHEST - 1 VIEW

[AP]
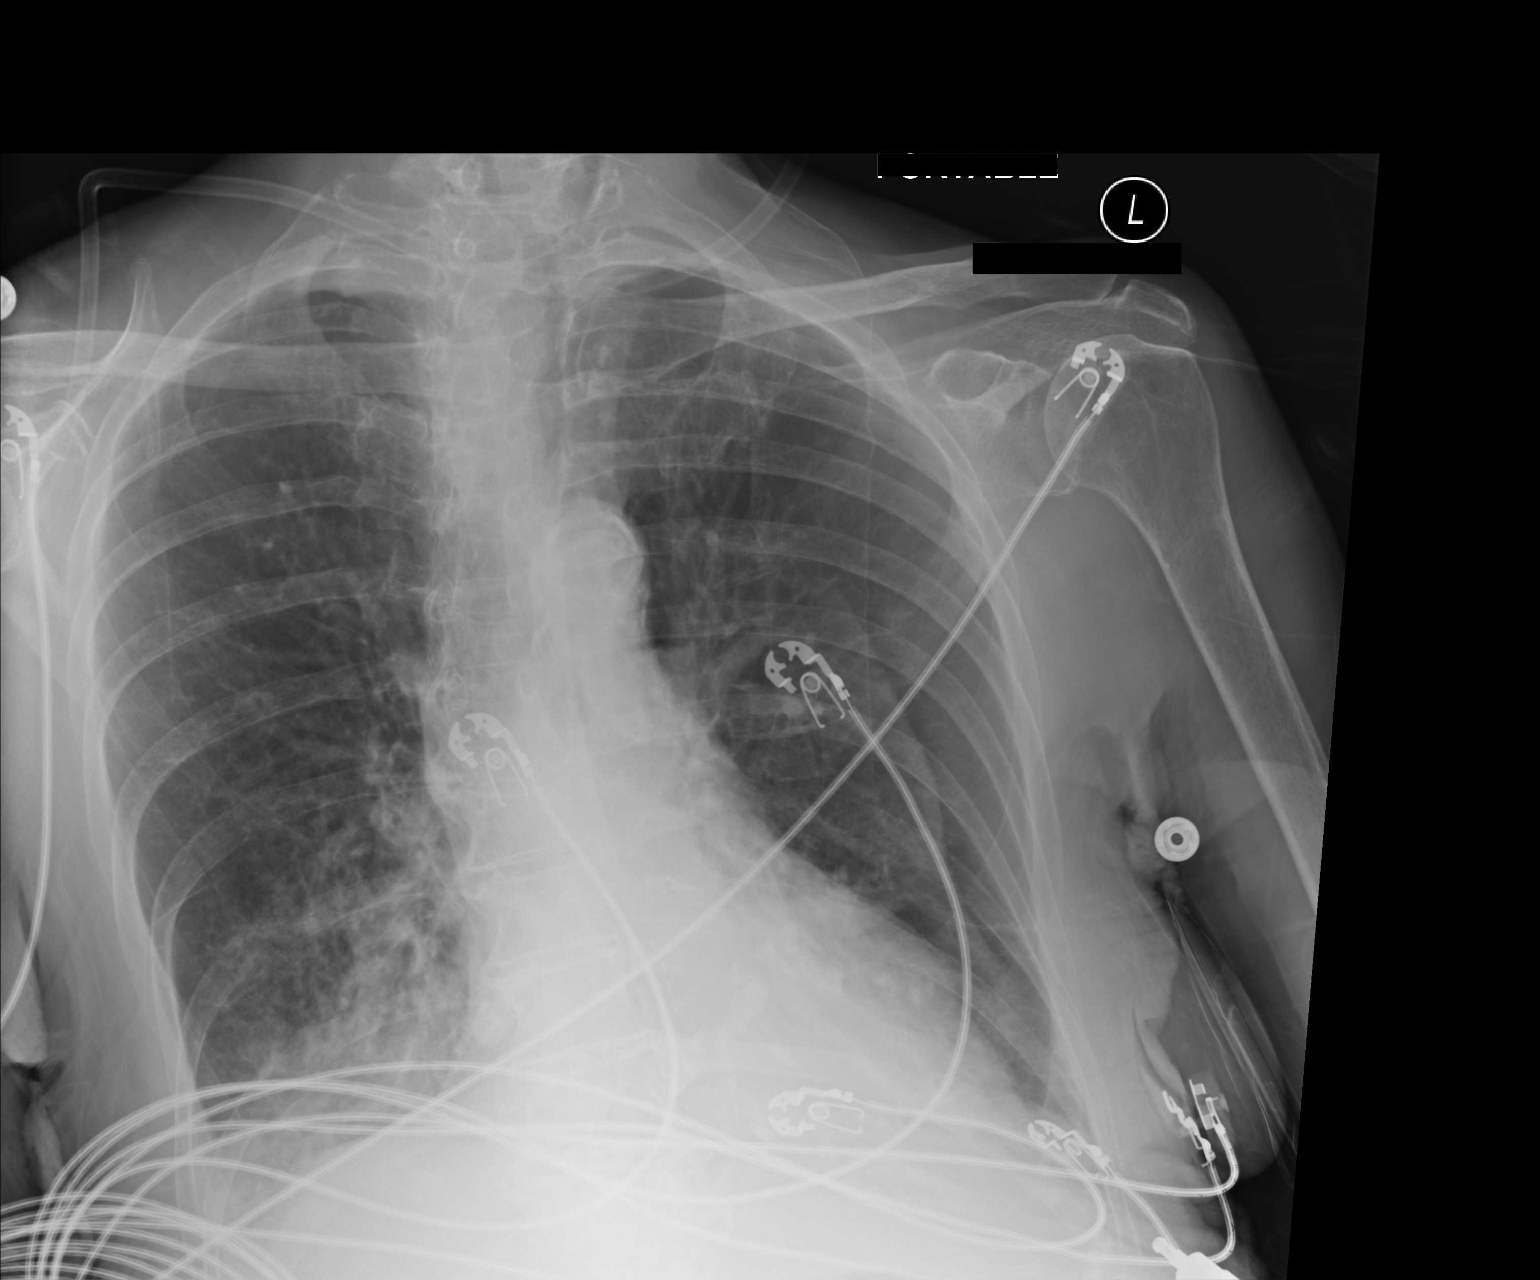

[1 of 1 positions shown; findings below may reference images not displayed]

FINDINGS: Bibasilar opacities may reflect areas of atelectasis and/or
consolidation, likely with superimposed small bilateral pleural
effusions. No evidence of pulmonary edema. Heart size appears
borderline enlarged. The patient is rotated to the left on today's
exam, resulting in distortion of the mediastinal contours and
reduced diagnostic sensitivity and specificity for mediastinal
pathology. Atherosclerosis in the thoracic aorta.
IMPRESSION: 1. New bibasilar opacities may reflect areas of atelectasis and/or
consolidation, with superimposed small bilateral pleural effusions.
2. Atherosclerosis.
